# Patient Record
Sex: Female | Born: 1960 | Race: Black or African American | Hispanic: No | Marital: Single | Smoking: Former smoker
Health system: Southern US, Community
[De-identification: ages and names within clinical notes are randomized; demographics above are authoritative.]

## PROBLEM LIST (undated history)

## (undated) DIAGNOSIS — B1910 Unspecified viral hepatitis B without hepatic coma: Secondary | ICD-10-CM

## (undated) DIAGNOSIS — M810 Age-related osteoporosis without current pathological fracture: Secondary | ICD-10-CM

## (undated) DIAGNOSIS — Z8742 Personal history of other diseases of the female genital tract: Secondary | ICD-10-CM

## (undated) DIAGNOSIS — J302 Other seasonal allergic rhinitis: Secondary | ICD-10-CM

## (undated) DIAGNOSIS — B373 Candidiasis of vulva and vagina: Secondary | ICD-10-CM

## (undated) DIAGNOSIS — M62830 Muscle spasm of back: Secondary | ICD-10-CM

## (undated) DIAGNOSIS — Z87898 Personal history of other specified conditions: Secondary | ICD-10-CM

## (undated) DIAGNOSIS — Z8619 Personal history of other infectious and parasitic diseases: Secondary | ICD-10-CM

## (undated) DIAGNOSIS — E119 Type 2 diabetes mellitus without complications: Secondary | ICD-10-CM

## (undated) DIAGNOSIS — F419 Anxiety disorder, unspecified: Secondary | ICD-10-CM

## (undated) DIAGNOSIS — N926 Irregular menstruation, unspecified: Secondary | ICD-10-CM

## (undated) DIAGNOSIS — T7840XA Allergy, unspecified, initial encounter: Secondary | ICD-10-CM

## (undated) DIAGNOSIS — N3941 Urge incontinence: Secondary | ICD-10-CM

## (undated) DIAGNOSIS — R102 Pelvic and perineal pain: Secondary | ICD-10-CM

## (undated) DIAGNOSIS — M1990 Unspecified osteoarthritis, unspecified site: Secondary | ICD-10-CM

## (undated) DIAGNOSIS — D219 Benign neoplasm of connective and other soft tissue, unspecified: Secondary | ICD-10-CM

## (undated) HISTORY — DX: Type 2 diabetes mellitus without complications: E11.9

## (undated) HISTORY — DX: Benign neoplasm of connective and other soft tissue, unspecified: D21.9

## (undated) HISTORY — DX: Personal history of other infectious and parasitic diseases: Z86.19

## (undated) HISTORY — DX: Personal history of other diseases of the female genital tract: Z87.42

## (undated) HISTORY — DX: Personal history of other specified conditions: Z87.898

## (undated) HISTORY — DX: Urge incontinence: N39.41

## (undated) HISTORY — PX: KNEE SURGERY: SHX244

## (undated) HISTORY — PX: WISDOM TOOTH EXTRACTION: SHX21

## (undated) HISTORY — DX: Pelvic and perineal pain: R10.2

## (undated) HISTORY — DX: Allergy, unspecified, initial encounter: T78.40XA

## (undated) HISTORY — DX: Irregular menstruation, unspecified: N92.6

## (undated) HISTORY — DX: Anxiety disorder, unspecified: F41.9

## (undated) HISTORY — DX: Age-related osteoporosis without current pathological fracture: M81.0

## (undated) HISTORY — DX: Unspecified viral hepatitis B without hepatic coma: B19.10

## (undated) HISTORY — PX: BREAST BIOPSY: SHX20

## (undated) HISTORY — DX: Unspecified osteoarthritis, unspecified site: M19.90

## (undated) HISTORY — DX: Candidiasis of vulva and vagina: B37.3

## (undated) HISTORY — PX: DILATION AND CURETTAGE OF UTERUS: SHX78

## (undated) HISTORY — PX: JOINT REPLACEMENT: SHX530

## (undated) HISTORY — DX: Muscle spasm of back: M62.830

---

## 1997-07-28 ENCOUNTER — Emergency Department (HOSPITAL_COMMUNITY): Admission: EM | Admit: 1997-07-28 | Discharge: 1997-07-28 | Payer: Self-pay | Admitting: Emergency Medicine

## 1998-08-08 ENCOUNTER — Other Ambulatory Visit: Admission: RE | Admit: 1998-08-08 | Discharge: 1998-08-08 | Payer: Self-pay | Admitting: Internal Medicine

## 1998-09-22 ENCOUNTER — Ambulatory Visit (HOSPITAL_BASED_OUTPATIENT_CLINIC_OR_DEPARTMENT_OTHER): Admission: RE | Admit: 1998-09-22 | Discharge: 1998-09-22 | Payer: Self-pay | Admitting: Orthopedic Surgery

## 2000-06-06 ENCOUNTER — Other Ambulatory Visit: Admission: RE | Admit: 2000-06-06 | Discharge: 2000-06-06 | Payer: Self-pay | Admitting: Obstetrics and Gynecology

## 2000-08-22 ENCOUNTER — Ambulatory Visit (HOSPITAL_COMMUNITY): Admission: RE | Admit: 2000-08-22 | Discharge: 2000-08-22 | Payer: Self-pay | Admitting: Obstetrics and Gynecology

## 2000-08-22 ENCOUNTER — Encounter: Payer: Self-pay | Admitting: Obstetrics and Gynecology

## 2001-12-12 ENCOUNTER — Other Ambulatory Visit: Admission: RE | Admit: 2001-12-12 | Discharge: 2001-12-12 | Payer: Self-pay | Admitting: Internal Medicine

## 2001-12-26 ENCOUNTER — Emergency Department (HOSPITAL_COMMUNITY): Admission: EM | Admit: 2001-12-26 | Discharge: 2001-12-26 | Payer: Self-pay | Admitting: Emergency Medicine

## 2002-08-14 ENCOUNTER — Encounter: Payer: Self-pay | Admitting: Emergency Medicine

## 2002-08-14 ENCOUNTER — Emergency Department (HOSPITAL_COMMUNITY): Admission: EM | Admit: 2002-08-14 | Discharge: 2002-08-14 | Payer: Self-pay | Admitting: Emergency Medicine

## 2002-08-16 ENCOUNTER — Emergency Department (HOSPITAL_COMMUNITY): Admission: EM | Admit: 2002-08-16 | Discharge: 2002-08-16 | Payer: Self-pay | Admitting: Emergency Medicine

## 2002-09-26 ENCOUNTER — Other Ambulatory Visit: Admission: RE | Admit: 2002-09-26 | Discharge: 2002-09-26 | Payer: Self-pay | Admitting: Obstetrics and Gynecology

## 2002-10-10 ENCOUNTER — Ambulatory Visit (HOSPITAL_COMMUNITY): Admission: RE | Admit: 2002-10-10 | Discharge: 2002-10-10 | Payer: Self-pay | Admitting: Obstetrics and Gynecology

## 2002-10-10 ENCOUNTER — Encounter: Payer: Self-pay | Admitting: Obstetrics and Gynecology

## 2004-02-09 DIAGNOSIS — B3731 Acute candidiasis of vulva and vagina: Secondary | ICD-10-CM

## 2004-02-09 DIAGNOSIS — B373 Candidiasis of vulva and vagina: Secondary | ICD-10-CM

## 2004-02-09 HISTORY — DX: Candidiasis of vulva and vagina: B37.3

## 2004-02-09 HISTORY — DX: Acute candidiasis of vulva and vagina: B37.31

## 2005-02-08 DIAGNOSIS — Z8742 Personal history of other diseases of the female genital tract: Secondary | ICD-10-CM

## 2005-02-08 HISTORY — DX: Personal history of other diseases of the female genital tract: Z87.42

## 2005-10-26 ENCOUNTER — Other Ambulatory Visit: Admission: RE | Admit: 2005-10-26 | Discharge: 2005-10-26 | Payer: Self-pay | Admitting: Obstetrics and Gynecology

## 2005-10-26 LAB — CONVERTED CEMR LAB: Pap Smear: NORMAL

## 2005-11-03 ENCOUNTER — Encounter: Admission: RE | Admit: 2005-11-03 | Discharge: 2005-11-03 | Payer: Self-pay | Admitting: Obstetrics and Gynecology

## 2005-11-03 ENCOUNTER — Encounter (INDEPENDENT_AMBULATORY_CARE_PROVIDER_SITE_OTHER): Payer: Self-pay | Admitting: *Deleted

## 2006-02-08 DIAGNOSIS — N3941 Urge incontinence: Secondary | ICD-10-CM

## 2006-02-08 HISTORY — DX: Urge incontinence: N39.41

## 2006-11-08 ENCOUNTER — Encounter: Payer: Self-pay | Admitting: Infectious Diseases

## 2006-11-14 DIAGNOSIS — B1910 Unspecified viral hepatitis B without hepatic coma: Secondary | ICD-10-CM

## 2006-11-14 HISTORY — DX: Unspecified viral hepatitis B without hepatic coma: B19.10

## 2006-12-14 ENCOUNTER — Ambulatory Visit: Payer: Self-pay | Admitting: Infectious Diseases

## 2006-12-14 DIAGNOSIS — D259 Leiomyoma of uterus, unspecified: Secondary | ICD-10-CM | POA: Insufficient documentation

## 2006-12-14 DIAGNOSIS — IMO0002 Reserved for concepts with insufficient information to code with codable children: Secondary | ICD-10-CM | POA: Insufficient documentation

## 2006-12-14 DIAGNOSIS — M1710 Unilateral primary osteoarthritis, unspecified knee: Secondary | ICD-10-CM

## 2006-12-14 DIAGNOSIS — B1910 Unspecified viral hepatitis B without hepatic coma: Secondary | ICD-10-CM | POA: Insufficient documentation

## 2006-12-14 LAB — CONVERTED CEMR LAB
Albumin: 3.6 g/dL (ref 3.5–5.2)
BUN: 9 mg/dL (ref 6–23)
Basophils Absolute: 0 10*3/uL (ref 0.0–0.1)
CO2: 25 meq/L (ref 19–32)
Calcium: 8.8 mg/dL (ref 8.4–10.5)
Chloride: 103 meq/L (ref 96–112)
Eosinophils Relative: 1 % (ref 0–5)
Glucose, Bld: 92 mg/dL (ref 70–99)
HCT: 39 % (ref 36.0–46.0)
HEP B PCR: 19700 — ABNORMAL HIGH (ref <100)
HIV 1 RNA Quant: <50 copies/mL (ref <50)
HIV-1 RNA Quant, Log: <1.7 (ref <1.70)
Hemoglobin: 12.8 g/dL (ref 12.0–15.0)
Hep A Total Ab: POSITIVE — AB
Hep B E Ag: NEGATIVE
Hepatitis B Surface Ag: POSITIVE — AB
Lymphocytes Relative: 28 % (ref 12–46)
Lymphs Abs: 3 10*3/uL (ref 0.7–3.3)
Monocytes Absolute: 0.6 10*3/uL (ref 0.2–0.7)
Monocytes Relative: 6 % (ref 3–11)
Neutro Abs: 6.9 10*3/uL (ref 1.7–7.7)
Potassium: 4.1 meq/L (ref 3.5–5.3)
RBC: 4.61 M/uL (ref 3.87–5.11)
RDW: 15.1 % — ABNORMAL HIGH (ref 11.5–14.0)
Sodium: 134 meq/L — ABNORMAL LOW (ref 135–145)
Total Protein: 7.3 g/dL (ref 6.0–8.3)

## 2007-01-03 ENCOUNTER — Ambulatory Visit: Payer: Self-pay | Admitting: Infectious Diseases

## 2007-01-10 ENCOUNTER — Encounter: Payer: Self-pay | Admitting: Infectious Diseases

## 2007-02-09 DIAGNOSIS — Z8742 Personal history of other diseases of the female genital tract: Secondary | ICD-10-CM

## 2007-02-09 DIAGNOSIS — Z87898 Personal history of other specified conditions: Secondary | ICD-10-CM

## 2007-02-09 HISTORY — DX: Personal history of other diseases of the female genital tract: Z87.42

## 2007-02-09 HISTORY — DX: Personal history of other specified conditions: Z87.898

## 2007-03-07 ENCOUNTER — Encounter: Admission: RE | Admit: 2007-03-07 | Discharge: 2007-03-07 | Payer: Self-pay | Admitting: Obstetrics and Gynecology

## 2007-03-13 ENCOUNTER — Encounter: Admission: RE | Admit: 2007-03-13 | Discharge: 2007-03-13 | Payer: Self-pay | Admitting: Obstetrics and Gynecology

## 2007-04-03 ENCOUNTER — Inpatient Hospital Stay (HOSPITAL_COMMUNITY): Admission: RE | Admit: 2007-04-03 | Discharge: 2007-04-05 | Payer: Self-pay | Admitting: Obstetrics and Gynecology

## 2007-04-03 ENCOUNTER — Encounter (INDEPENDENT_AMBULATORY_CARE_PROVIDER_SITE_OTHER): Payer: Self-pay | Admitting: Obstetrics and Gynecology

## 2007-04-03 DIAGNOSIS — D219 Benign neoplasm of connective and other soft tissue, unspecified: Secondary | ICD-10-CM

## 2007-04-03 HISTORY — PX: MYOMECTOMY ABDOMINAL APPROACH: SUR870

## 2007-04-03 HISTORY — DX: Benign neoplasm of connective and other soft tissue, unspecified: D21.9

## 2007-05-09 ENCOUNTER — Telehealth (INDEPENDENT_AMBULATORY_CARE_PROVIDER_SITE_OTHER): Payer: Self-pay | Admitting: *Deleted

## 2007-08-21 ENCOUNTER — Encounter: Payer: Self-pay | Admitting: Infectious Diseases

## 2008-06-12 ENCOUNTER — Inpatient Hospital Stay (HOSPITAL_COMMUNITY): Admission: RE | Admit: 2008-06-12 | Discharge: 2008-06-14 | Payer: Self-pay | Admitting: Orthopedic Surgery

## 2010-02-26 ENCOUNTER — Other Ambulatory Visit: Payer: Self-pay | Admitting: Family Medicine

## 2010-02-26 ENCOUNTER — Ambulatory Visit
Admission: RE | Admit: 2010-02-26 | Discharge: 2010-02-26 | Payer: Self-pay | Source: Home / Self Care | Attending: Family Medicine | Admitting: Family Medicine

## 2010-02-26 DIAGNOSIS — M1990 Unspecified osteoarthritis, unspecified site: Secondary | ICD-10-CM | POA: Insufficient documentation

## 2010-02-26 LAB — CBC WITH DIFFERENTIAL/PLATELET
Basophils Absolute: 0 10*3/uL (ref 0.0–0.1)
Basophils Relative: 0.4 % (ref 0.0–3.0)
Eosinophils Absolute: 0.1 10*3/uL (ref 0.0–0.7)
Eosinophils Relative: 0.8 % (ref 0.0–5.0)
HCT: 37.1 % (ref 36.0–46.0)
Hemoglobin: 12.2 g/dL (ref 12.0–15.0)
Lymphocytes Relative: 32.6 % (ref 12.0–46.0)
Lymphs Abs: 2.7 10*3/uL (ref 0.7–4.0)
MCHC: 32.8 g/dL (ref 30.0–36.0)
MCV: 85.9 fl (ref 78.0–100.0)
Monocytes Absolute: 0.4 10*3/uL (ref 0.1–1.0)
Monocytes Relative: 5.1 % (ref 3.0–12.0)
Neutro Abs: 5 10*3/uL (ref 1.4–7.7)
Neutrophils Relative %: 61.1 % (ref 43.0–77.0)
Platelets: 193 10*3/uL (ref 150.0–400.0)
RBC: 4.32 Mil/uL (ref 3.87–5.11)
RDW: 15.4 % — ABNORMAL HIGH (ref 11.5–14.6)
WBC: 8.2 10*3/uL (ref 4.5–10.5)

## 2010-02-26 LAB — LIPID PANEL
Cholesterol: 149 mg/dL (ref 0–200)
HDL: 51.9 mg/dL (ref >39.00)
LDL Cholesterol: 87 mg/dL (ref 0–99)
Total CHOL/HDL Ratio: 3
Triglycerides: 53 mg/dL (ref 0.0–149.0)
VLDL: 10.6 mg/dL (ref 0.0–40.0)

## 2010-02-26 LAB — BASIC METABOLIC PANEL
BUN: 13 mg/dL (ref 6–23)
CO2: 26 mEq/L (ref 19–32)
Calcium: 8.7 mg/dL (ref 8.4–10.5)
Chloride: 101 mEq/L (ref 96–112)
Creatinine, Ser: 0.8 mg/dL (ref 0.4–1.2)
GFR: 101.47 mL/min (ref >60.00)
Glucose, Bld: 83 mg/dL (ref 70–99)
Potassium: 4.2 mEq/L (ref 3.5–5.1)
Sodium: 134 mEq/L — ABNORMAL LOW (ref 135–145)

## 2010-02-26 LAB — HEPATIC FUNCTION PANEL
ALT: 20 U/L (ref 0–35)
AST: 19 U/L (ref 0–37)
Albumin: 3.7 g/dL (ref 3.5–5.2)
Alkaline Phosphatase: 84 U/L (ref 39–117)
Bilirubin, Direct: 0.1 mg/dL (ref 0.0–0.3)
Total Bilirubin: 0.7 mg/dL (ref 0.3–1.2)
Total Protein: 7.2 g/dL (ref 6.0–8.3)

## 2010-02-26 LAB — TSH: TSH: 1.13 u[IU]/mL (ref 0.35–5.50)

## 2010-03-01 ENCOUNTER — Encounter: Payer: Self-pay | Admitting: Obstetrics and Gynecology

## 2010-03-12 NOTE — Assessment & Plan Note (Signed)
Summary: BRAND NEW PT/TO EST/PT REQ CPX/COMING IN FASTING/CJR   Vital Signs:  Patient profile:   50 year old female Height:      67 inches Weight:      195 pounds BMI:     30.65 Temp:     98.2 degrees F oral Pulse rate:   64 / minute Pulse rhythm:   regular BP sitting:   116 / 74  (left arm) Cuff size:   regular  Vitals Entered By: Alfred Levins, CMA (February 26, 2010 10:31 AM)  Nutrition Counseling: Patient's BMI is greater than 25 and therefore counseled on weight management options. CC: npx, fasting, no pap   History of Present Illness: New patient to establish care. Patient has history of osteoarthritis with previous right total knee replacement. Questionable history of elevated liver transaminases. No clear history of infectious hepatitis. Reported remote history of peptic ulcers. Patient has fibroid tumors in her uterus and scheduled for hysterectomy this June. Takes no regular medications.  Family history father had lung cancer. Mother with hypertension. Sr. with type 2 diabetes.  Social history the patient is single. One grown child. Smokes one half packs cigs per day and trying to quit. Rare alcohol use. Stressful job with [employer].  Allergies (verified): No Known Drug Allergies  Past History:  Family History: Last updated: 02/26/2010 Family History Lung cancer- father Family History Diabetes 1st degree relative-sister Family History Hypertension  Social History: Last updated: 12/14/2006 Single Former Smoker Alcohol use-no  Risk Factors: Smoking Status: quit (12/14/2006)  Past Medical History: Osteoarthritis  Past Surgical History: Total knee replacement R 2010 PMH-FH-SH reviewed for relevance  Family History: Family History Lung cancer- father Family History Diabetes 1st degree relative-sister Family History Hypertension  Review of Systems       The patient complains of weight gain.  The patient denies anorexia, fever, weight loss,  decreased hearing, hoarseness, chest pain, syncope, dyspnea on exertion, peripheral edema, prolonged cough, headaches, hemoptysis, abdominal pain, melena, hematochezia, severe indigestion/heartburn, hematuria, incontinence, genital sores, muscle weakness, suspicious skin lesions, transient blindness, difficulty walking, depression, unusual weight change, abnormal bleeding, enlarged lymph nodes, and breast masses.    Physical Exam  General:  Well-developed,well-nourished,in no acute distress; alert,appropriate and cooperative throughout examination Head:  Normocephalic and atraumatic without obvious abnormalities. No apparent alopecia or balding. Eyes:  No corneal or conjunctival inflammation noted. EOMI. Perrla. Funduscopic exam benign, without hemorrhages, exudates or papilledema. Vision grossly normal. Ears:  External ear exam shows no significant lesions or deformities.  Otoscopic examination reveals clear canals, tympanic membranes are intact bilaterally without bulging, retraction, inflammation or discharge. Hearing is grossly normal bilaterally. Mouth:  Oral mucosa and oropharynx without lesions or exudates.  Teeth in good repair. Neck:  No deformities, masses, or tenderness noted. Breasts:  gyn Lungs:  Normal respiratory effort, chest expands symmetrically. Lungs are clear to auscultation, no crackles or wheezes. Heart:  Normal rate and regular rhythm. S1 and S2 normal without gallop, murmur, click, rub or other extra sounds. Abdomen:  Bowel sounds positive,abdomen soft and non-tender without masses, organomegaly or hernias noted. Genitalia:  gyn Extremities:  No clubbing, cyanosis, edema, or deformity noted with normal full range of motion of all joints.   Neurologic:  alert & oriented X3 and cranial nerves II-XII intact.   Skin:  no rashes and no suspicious lesions.   Cervical Nodes:  No lymphadenopathy noted Psych:  Oriented X3, normally interactive, good eye contact, not anxious  appearing, and not depressed appearing.  Impression & Recommendations:  Problem # 1:  Preventive Health Care (ICD-V70.0) screening labs.  Needs to lose some weight.  Start exercise.  Tdap. Cont gyn f/u.  Other Orders: Specimen Handling (40102) Venipuncture (72536) Tdap => 2yrs IM (64403) Admin 1st Vaccine (47425) TLB-Lipid Panel (80061-LIPID) TLB-BMP (Basic Metabolic Panel-BMET) (80048-METABOL) TLB-CBC Platelet - w/Differential (85025-CBCD) TLB-Hepatic/Liver Function Pnl (80076-HEPATIC) TLB-TSH (Thyroid Stimulating Hormone) (84443-TSH)  Patient Instructions: 1)  It is important that you exercise reguarly at least 20 minutes 5 times a week. If you develop chest pain, have severe difficulty breathing, or feel very tired, stop exercising immediately and seek medical attention.  2)  You need to lose weight. Consider a lower calorie diet and regular exercise.  3)  Schedule your mammogram.    Orders Added: 1)  New Patient 40-64 years [99386] 2)  Specimen Handling [99000] 3)  Venipuncture [36415] 4)  Tdap => 27yrs IM [90715] 5)  Admin 1st Vaccine [90471] 6)  TLB-Lipid Panel [80061-LIPID] 7)  TLB-BMP (Basic Metabolic Panel-BMET) [80048-METABOL] 8)  TLB-CBC Platelet - w/Differential [85025-CBCD] 9)  TLB-Hepatic/Liver Function Pnl [80076-HEPATIC] 10)  TLB-TSH (Thyroid Stimulating Hormone) [95638-VFI]   Immunizations Administered:  Tetanus Vaccine:    Vaccine Type: Tdap    Site: left deltoid    Mfr: GlaxoSmithKline    Dose: 0.5 ml    Route: IM    Given by: Alfred Levins, CMA    Exp. Date: 11/28/2011    Lot #: [id number]   Immunizations Administered:  Tetanus Vaccine:    Vaccine Type: Tdap    Site: left deltoid    Mfr: GlaxoSmithKline    Dose: 0.5 ml    Route: IM    Given by: Alfred Levins, CMA    Exp. Date: 11/28/2011    Lot #: [id number]

## 2010-03-30 ENCOUNTER — Ambulatory Visit (HOSPITAL_COMMUNITY): Admission: RE | Admit: 2010-03-30 | Payer: Self-pay | Admitting: Obstetrics and Gynecology

## 2010-05-19 LAB — GLUCOSE, CAPILLARY
Glucose-Capillary: 112 mg/dL — ABNORMAL HIGH (ref 70–99)
Glucose-Capillary: 133 mg/dL — ABNORMAL HIGH (ref 70–99)

## 2010-05-19 LAB — URINE CULTURE
Colony Count: NO GROWTH
Culture: NO GROWTH

## 2010-05-19 LAB — CBC
MCHC: 34.1 g/dL (ref 30.0–36.0)
Platelets: 150 10*3/uL (ref 150–400)
Platelets: 176 10*3/uL (ref 150–400)
RDW: 14.9 % (ref 11.5–15.5)
RDW: 15 % (ref 11.5–15.5)
WBC: 9.5 10*3/uL (ref 4.0–10.5)

## 2010-05-19 LAB — PROTIME-INR
INR: 1.3 (ref 0.00–1.49)
INR: 1.9 — ABNORMAL HIGH (ref 0.00–1.49)
Prothrombin Time: 22.8 seconds — ABNORMAL HIGH (ref 11.6–15.2)

## 2010-05-19 LAB — BASIC METABOLIC PANEL
BUN: 3 mg/dL — ABNORMAL LOW (ref 6–23)
BUN: 4 mg/dL — ABNORMAL LOW (ref 6–23)
Calcium: 8.3 mg/dL — ABNORMAL LOW (ref 8.4–10.5)
Chloride: 100 mEq/L (ref 96–112)
Creatinine, Ser: 0.78 mg/dL (ref 0.4–1.2)
Creatinine, Ser: 0.91 mg/dL (ref 0.4–1.2)
GFR calc non Af Amer: >60 mL/min (ref >60)
GFR calc non Af Amer: >60 mL/min (ref >60)
Glucose, Bld: 138 mg/dL — ABNORMAL HIGH (ref 70–99)
Potassium: 4 mEq/L (ref 3.5–5.1)

## 2010-05-19 LAB — ABO/RH: ABO/RH(D): O POS

## 2010-05-19 LAB — TYPE AND SCREEN: ABO/RH(D): O POS

## 2010-05-20 LAB — APTT: aPTT: 31 seconds (ref 24–37)

## 2010-05-20 LAB — BASIC METABOLIC PANEL
BUN: 6 mg/dL (ref 6–23)
CO2: 28 mEq/L (ref 19–32)
Chloride: 108 mEq/L (ref 96–112)
Potassium: 4.3 mEq/L (ref 3.5–5.1)

## 2010-05-20 LAB — HEPATIC FUNCTION PANEL
AST: 18 U/L (ref 0–37)
Albumin: 3.6 g/dL (ref 3.5–5.2)
Alkaline Phosphatase: 104 U/L (ref 39–117)
Total Bilirubin: 0.3 mg/dL (ref 0.3–1.2)
Total Protein: 7.2 g/dL (ref 6.0–8.3)

## 2010-05-20 LAB — URINALYSIS, ROUTINE W REFLEX MICROSCOPIC
Bilirubin Urine: NEGATIVE
Ketones, ur: NEGATIVE mg/dL
Nitrite: NEGATIVE
Protein, ur: NEGATIVE mg/dL
Urobilinogen, UA: 1 mg/dL (ref 0.0–1.0)
pH: 5.5 (ref 5.0–8.0)

## 2010-05-20 LAB — PROTIME-INR
INR: 1 (ref 0.00–1.49)
Prothrombin Time: 13.7 seconds (ref 11.6–15.2)

## 2010-05-20 LAB — CBC
HCT: 39.4 % (ref 36.0–46.0)
Hemoglobin: 13.4 g/dL (ref 12.0–15.0)
RDW: 15.1 % (ref 11.5–15.5)
WBC: 10.2 10*3/uL (ref 4.0–10.5)

## 2010-05-20 LAB — URINE MICROSCOPIC-ADD ON

## 2010-06-23 NOTE — Op Note (Signed)
NAMEJULIEANNE, Sandra Hawkins           ACCOUNT NO.:  [id number]   MEDICAL RECORD NO.:  [id number]          PATIENT TYPE:  INP   LOCATION:  5007                         FACILITY:  [facility]   PHYSICIAN:  Loreta Ave, M.D. DATE OF BIRTH:  03-31-1960   DATE OF PROCEDURE:  06/12/2008  DATE OF DISCHARGE:                               OPERATIVE REPORT   PREOPERATIVE DIAGNOSIS:  Right knee degenerative arthritis, end stage,  varus alignment.   POSTOPERATIVE DIAGNOSIS:  Right knee degenerative arthritis, end stage,  varus alignment.   PROCEDURE:  Modified minimally invasive right total knee replacement,  Stryker Triathlon prosthesis.  Soft tissue balancing.  Cemented pegged  posterior stabilized #4 femoral component.  Cemented #5 tibial component  with 11-mm polyethylene insert.  Cemented pegged medial offset 35 mm  patellar component.   SURGEON:  Loreta Ave, M.D.   ASSISTANT:  Genene Churn. Barry Dienes, [place], present throughout the entire case as  necessary for timely completion of procedure.   ANESTHESIA:  General.   BLOOD LOSS:  Minimal.   TOURNIQUET TIME:  1 hour and 50 minutes.   SPECIMENS:  None.   CULTURES:  None.   COMPLICATIONS:  None.   DRESSINGS:  Sterile compressive with a knee immobilizer.   DRAIN:  Hemovac x1.   PROCEDURE:  The patient was brought to the operating room, placed on the  operating table in supine position.  After adequate anesthesia had been  obtained, knee examined.  Varus alignment correctable to at least  neutral.  Full extension, full flexion.  Stable ligaments.  Tourniquet  applied.  Prepped and draped in usual sterile fashion.  Exsanguinated  with elevation Esmarch.  Tourniquet inflated to 350 mmHg.  Straight  incision above the patella down to the tibial tubercle.  Medial  arthrotomy up to the superomedial border of the patella, splitting  the  vastus muscle, preserving the quad tendon.  Knee exposed.  Grade 4  change throughout.  Medial capsular  released.  Remnants of menisci,  cruciate ligament, loose bodies, and spurs were removed.  Distal femur  exposed.  Intramedullary guide was placed.  Distal cut 10 mm, set at 5  degrees of valgus.  Using epicondylar axis, the femur was sized, cut,  and fitted for #4 posterior stabilized peg component.  Tibia exposed.  Extramedullary guide.  A 3-degree posterior slope cut below the defect  on the medial side where most of the wear was.  Sized to #5 component.  All recess examined.  All loose bodies, all spurs were removed.  Nice  release of soft tissue.  With the trials in place, #4 above, #5 below  and a 11-mm insert, I had a nicely balanced knee, normal mechanical  axis, full extension, full flexion.  Tibia was marked for appropriate  rotation with trials.  Patella exposed.  Measured, cut, drilled, sized  and fitted for # 35 component.  Trials were removed.  Copious irrigation  with pulse irrigating device.  Tibia was then punched for its keel  utilizing the marks from the trials up.  Cement prepared and placed on  all components.  All were  firmly seated.  Polyethylene attached to  tibia.  Patellar component held in place with clamp.  Once the cement  hardened, we examined.  Again very pleased with alignment, stability,  mechanical axis, patella femoral tracking.  Wound irrigated.  Hemovac  placed through a separate stab wound.  Arthrotomy closed with #1 Vicryl.  Skin and subcutaneous tissue with Vicryl and staples.  Knee injected  with Marcaine.  Hemovac clamped.  Sterile compressive dressing applied.  Tourniquet was inflated and removed.  Knee immobilizer applied.  Anesthesia reversed.  Brought to recovery room.  Tolerated surgery well.  No complications.      Loreta Ave, M.D.  Electronically Signed     DFM/MEDQ  D:  06/12/2008  T:  06/13/2008  Job:  [id number]

## 2010-06-23 NOTE — Discharge Summary (Signed)
Sandra Hawkins, Sandra Hawkins           ACCOUNT NO.:  [id number]   MEDICAL RECORD NO.:  [id number]          PATIENT TYPE:  INP   LOCATION:  5007                         FACILITY:  [facility]   PHYSICIAN:  Loreta Ave, M.D. DATE OF BIRTH:  1960-08-17   DATE OF ADMISSION:  06/12/2008  DATE OF DISCHARGE:                               DISCHARGE SUMMARY   FINAL DIAGNOSIS:  Status post right total knee replacement for post-  traumatic end-stage degenerative joint disease.   HISTORY OF PRESENT ILLNESS:  A 50 year old black female with history of  right knee post-traumatic end-stage DJD and knee pain presented to our  office for a preop evaluation for total knee replacement.  She had a  progressively worsening pain with failed response with conservative  treatment.  Significant decrease in her daily activities due to the  ongoing complaint.   HOSPITAL COURSE:  On Jun 12, 2008, patient was taken to the [facility] OR  and a right total knee replacement procedure performed.  Surgeon, Mckinley Jewel, MD, and assistant, Zonia Kief, PA-C.  Anesthesia, general.  EBL  minimal.  Tourniquet time, 1 hour and 50 minutes.  No specimens.  One  Hemovac drain placed.  There were no surgical or anesthesia  complications and patient was transferred to recovery in stable  condition.  Pharmacy protocol Coumadin started for DVT prophylaxis.  Jun 13, 2008, patient doing well.  Pain control.  No complaints.  Temp 98.8.  Pulse 101.  Respirations 18.  Blood pressure 109/63.  WBC 9.5,  hemoglobin 10.3, hematocrit 30.5, platelet count 176, INR 1.3, sodium  136, potassium 4.0, chloride 100, CO2 of 30, BUN 4, creatinine 0.91,  glucose 113.  Dressing clean, dry, and intact.  Calf nontender.  Neurovascularly intact.  PT/OT consults.  Social worker arranging  discharge.  Jun 14, 2008, patient doing very well.  No issues.  She is  progressing well with therapy.  She is hoping to be able to transfer to  [facility] for  rehab.  She does not have any assistance at home.  Worker's Comp not approving home health therapy.  Temp 99.1.  Pulse 95.  Respirations 18.  Blood pressure 97/58.  WBC 10.9, hemoglobin 9.1,  hematocrit 26.8, platelets 150, INR 1.9, sodium 133, potassium 4.2,  chloride 101, CO2 of 25, BUN 3, creatinine 0.78, glucose 138.  Wound  looks good and staples intact.  No drainage or signs of infection.  Hemovac drain discontinued.  Calf nontender at rest.   CONDITION:  Good and stable and ready for transfer when bed available.   DISPOSITION:  Transfer to a skilled nursing facility.   MEDICATIONS:  1. Percocet 5/325 one to 2 tabs p.o. every 4 to 6 hours p.r.n. for      pain.  2. Robaxin 500 mg 1 tab p.o. every 6 hours p.r.n. for spasms.  3. Coumadin, pharmacy protocol.  Maintain INR of 2 to 3 x4 weeks      postop for DVT prophylaxis.   INSTRUCTIONS:  Patient will work with PT and OT to improve ambulation  and knee range of motion and strengthening.  Weight bear as tolerated.  She is okay to shower but no tub soaking.  Daily dressing changes with 4  x 4 gauze and tape.  Do not apply any creams or ointments to her  incision.  Staples will be removed in our office when  she is 2 weeks postop.  She can wean off her walker to a cane when she  is able.  Coumadin x4 weeks postop for DVT prophylaxis.  She will follow  up in the office with Dr. Eulah Pont when she is 2 weeks postop for recheck.  Notify our office immediately if there are any questions or concerns  before that time at [phone].      Genene Churn. Denton Meek.      Loreta Ave, M.D.  Electronically Signed    JMO/MEDQ  D:  06/14/2008  T:  06/14/2008  Job:  [id number]

## 2010-06-23 NOTE — Op Note (Signed)
Sandra Hawkins, Sandra Hawkins           ACCOUNT NO.:  [id number]   MEDICAL RECORD NO.:  [id number]          PATIENT TYPE:  INP   LOCATION:  9319                          FACILITY[facility]  [facility]   PHYSICIAN:  Osborn Coho, M.D.   DATE OF BIRTH:  [birth year]   DATE OF PROCEDURE:  04/04/2007  DATE OF DISCHARGE:                               OPERATIVE REPORT   PREOPERATIVE DIAGNOSIS:  Symptomatic fibroids.   POSTOPERATIVE DIAGNOSIS:  Symptomatic fibroids.   PROCEDURE:  Abdominal myomectomy.   ATTENDING:  Osborn Coho, M.D.   ASSISTANT:  Jaymes Graff, MD   ANESTHESIA:  General.   SPECIMENS TO PATHOLOGY:  48 fibroids, one of which was sent separately  secondary to questionable degeneration.   FLUIDS:  2400 mL.   URINE OUTPUT:  200 mL.   ESTIMATED BLOOD LOSS:  450 mL.   COMPLICATIONS:  None.   PROCEDURE:  The patient was taken to the operating room after risks,  benefits, and alternatives reviewed with the patient.  The patient  verbalized understanding and consent signed and witnessed.  The patient  was placed under general per anesthesia and prepped and draped in a  normal sterile fashion in the supine position.  A Pfannenstiel skin  incision was made and carried down to the underlying layer of fascia  with the scalpel and the Bovie.  The fascia was excised bilaterally in  the midline extended bilaterally with the Mayo scissors.  Kocher clamps  were placed on the superior aspect of fascial incision and the rectus  muscles excised from the fascia.  The same was done on the inferior  aspect of fascial incision.  The muscle was separated in midline and the  peritoneum entered with the Metzenbaum scissors and bluntly while  tenting up.  The peritoneal incision was extended manually and O'Connor-  O'Sullivan self-retaining retractor was placed after the laparotomy  sponges placed.  There were multiple fibroids noted and each was removed  after injecting with dilute Pitressin, excising  with the Bovie and  shelling out and removing.  The anterior wall was performed first and  then repaired and the posterior wall was then performed and repaired.  The largest fibroid was noted in the posterior wall and that was the one  which was noted to be questionably degenerative and measured  approximately 8 cm.  Attention was then turned to the fundus where  approximately 3-4 cm sized fundal fibroid was removed and the area  repaired.  All fibroids were sent to pathology.  There did not seem to  be any more fibroids remaining; however, secondary to the multiple  nature of her fibroids, it is possible that very small fibroids were  left behind.  Clearly the majority of fibroids were removed and no  obvious fibroids remained.  There was only slight oozing at the incision  sites which were mostly hemostatic.  To ensure hemostasis, Surgicel was  applied to all of the inside surfaces.  This was performed after copious  intra-abdominal irrigation.  The laparotomy sponges were removed with  care in order to keep the Surgicel in place.  The O'Connor-O'Sullivan  was  removed as well.  The peritoneum was repaired with 2-0 chromic in a  running fashion.  The fascia was repaired with 0-0 Vicryl in a running  fashion.  The subcutaneous tissue was reapproximated using 2-0 plain via  four interrupted stitches and the skin was reapproximated using 3-0  Monocryl via a subcuticular stitch and Dermabond applied as well.  Sponge lap and needle count was correct.  The patient tolerated  procedure well and was returned to the recovery room in good condition.      Osborn Coho, M.D.  Electronically Signed     AR/MEDQ  D:  04/04/2007  T:  04/04/2007  Job:  [id number]

## 2010-06-23 NOTE — H&P (Signed)
Sandra Hawkins, Sandra Hawkins           ACCOUNT NO.:  [id number]   MEDICAL RECORD NO.:  [id number]          PATIENT TYPE:  INP   LOCATION:  9319                          FACILITY:  [facility]   PHYSICIAN:  Osborn Coho, M.D.   DATE OF BIRTH:  02-Jan-1961   DATE OF ADMISSION:  04/03/2007  DATE OF DISCHARGE:                              HISTORY & PHYSICAL   CHIEF COMPLAINT:  Heavy menstrual cycles and frequent urination.   HISTORY:  Sandra Hawkins is a 50 year old gravida 3, para 1 with the last  menstrual period of March 08, 2007 with fibroids and complaints of  heavy menstrual cycles and frequent urination that she believes is also  the result of her fibroids.  The options were discussed with Ms.  Hawkins and they included hormonal management versus surgical  management.  The patient wanted removal of the fibroids and preservation  of her uterus.  She did report that she had wanted to preserve  fertility.  She understands that even after the myomectomy she may be  unable to conceive with the possibility of scarring that may occur, and  also because of age.   PAST OB HISTORY:  Normal spontaneous vaginal delivery full term x1,  spontaneous abortion x1, elected termination of pregnancy x1.   PAST GYN HISTORY:  History of regular cycles.  Denies a history of  gonorrhea or chlamydia.  Positive recent acute episode of hepatitis B,  normal mammogram per patient within the last month and denies a history  of abnormal Pap smears.   PAST MEDICAL HISTORY:  Denies except recent acute episode of hepatitis B  and rheumatoid arthritis.   PAST SURGICAL HISTORY:  D&C and arthroscopic knee surgery.   MEDICINES:  None.   ALLERGIES:  No known drug allergies.   SOCIAL HISTORY:  Denies cigarette use, alcohol use, or drug use, and  lives alone.   FAMILY HISTORY:  History of cancer in her father which was lung cancer  and maternal grandmother with asthma and diabetes.   REVIEW OF SYSTEMS:  Denies chest  pain or shortness of breath with  activity, and denies any recent fevers, chills, nausea, vomiting, GI or  GU problems.   PHYSICAL EXAM:  HEENT: Within normal limits.  NECK:  Thyroid not enlarged.  HEART:  Rate and rhythm are regular.  CHEST:  Clear to auscultation bilaterally.  BREASTS:  No masses or discharge, skin changes, or nipple retraction.  BACK:  No CVA tenderness.  ABDOMEN: Soft and nontender.  EXTREMITIES: Within normal limits.  PELVIC EXAM:  External genitalia within normal limits.  Uterus  nontender, and no palpable adnexal masses.  The patient had an  ultrasound, approximately a year ago, which showed at least 5 fibroids  with a largest measuring almost 6 cm.   ASSESSMENT/PLAN:  Sandra Hawkins is a 50 year old para 1 with symptomatic  fibroids desiring a myomectomy.  Abdominal myomectomy was discussed at  length with the patient.  The risks, benefits, and alternatives were  discussed as well; including but not limited to bleeding, infection,  injury, and risk of infertility.  The patient verbalized understanding,  and consent to  be signed and witnessed the day of the procedure.  The  patient's questions answered.      Osborn Coho, M.D.  Electronically Signed     AR/MEDQ  D:  04/04/2007  T:  04/04/2007  Job:  [id number]

## 2010-06-26 NOTE — Discharge Summary (Signed)
Sandra Hawkins, Sandra Hawkins           ACCOUNT NO.:  [id number]   MEDICAL RECORD NO.:  [id number]          PATIENT TYPE:  INP   LOCATION:  9319                          FACILITY[facility]  [facility]   PHYSICIAN:  Osborn Coho, M.D.   DATE OF BIRTH:  1960/07/12   DATE OF ADMISSION:  04/03/2007  DATE OF DISCHARGE:  04/05/2007                               DISCHARGE SUMMARY   DISCHARGE DIAGNOSIS:  Symptomatic uterine fibroids.   OPERATION:  On the date of admission, the patient underwent an abdominal  myomectomy tolerating the procedure well. The patient had 48 fibroids  removed.   HISTORY OF PRESENT ILLNESS:  Sandra Hawkins is a 50 year old female, para  1-0-2-1, who presents for abdominal myomectomy because of heavy  menstrual cycles and urinary frequency, attributed to her enlarging  fibroids.  Please see patient's dictated history and physical  examination for details.   PHYSICAL EXAMINATION:  GENERAL EXAM:  Within  normal limits.  PELVIC EXAM:  External genitalia was within normal limits.  Uterus was  nontender without any palpable adnexal masses. The patient had an  ultrasound which showed at least 5 fibroids with the largest measuring  approximately 6 cm.   HOSPITAL COURSE:  On the date of admission the patient underwent  aforementioned procedure, tolerating it well.  Postoperative course was  unremarkable with the patient tolerating the postop hemoglobin of 9.2  (preop hemoglobin 13.4).  By postop day #2, the patient had resumed  bowel and bladder function, and was therefore deemed ready for discharge  home.  The patient's discharge basic metabolic panel was within normal  limits.   DISCHARGE MEDICATIONS:  1. Colace 100 mg twice daily until bowel movements are regular.  2. Repliva 1 tablet daily for 21 days, discontinue for 7 days, then      resume cycle again for another 27 days.  3. Motrin 600 mg every 6 hours as needed for pain.  4. Dilaudid 2 mg 1 or 2 tablets every 4 hours as needed  for severe      pain.   FOLLOWUP:  The patient is to follow up with Dr. Su Hilt for 6 weeks  postoperative visit on May 16, 2007, at 2 p.m.   DISCHARGE INSTRUCTIONS:  The patient was given a copy of [facility] postoperative instruction sheet.  She was further  advised to avoid driving for 2 weeks, heavy lifting for 4 weeks,  intercourse for 6 weeks, that she may use the shower, she may walk up  steps.  She is to keep her incisions clean and dry.  Her diet was  without restriction.   FINAL PATHOLOGY:  Uterus myomectomy:  Leiomyomata.  Uterus myomectomy:  Smooth muscle tumor with extensive necrosis and degenerative changes.      [place] J. Adline Peals.      Osborn Coho, M.D.  Electronically Signed    EJP/MEDQ  D:  04/22/2007  T:  04/23/2007  Job:  [id number]

## 2010-10-30 LAB — BASIC METABOLIC PANEL
CO2: 28
Chloride: 104
GFR calc Af Amer: >60
Potassium: 4.1

## 2010-10-30 LAB — CBC
HCT: 26.9 — ABNORMAL LOW
Hemoglobin: 13.4
MCHC: 33.9
MCHC: 34.1
MCV: 83.5
MCV: 84.6
RBC: 3.18 — ABNORMAL LOW
RBC: 4.74
WBC: 10.1
WBC: 15.4 — ABNORMAL HIGH

## 2010-10-30 LAB — HCG, SERUM, QUALITATIVE: Preg, Serum: NEGATIVE

## 2011-04-19 ENCOUNTER — Ambulatory Visit: Payer: Self-pay | Admitting: Family Medicine

## 2011-07-16 ENCOUNTER — Encounter: Payer: Self-pay | Admitting: Obstetrics and Gynecology

## 2011-07-16 ENCOUNTER — Ambulatory Visit (INDEPENDENT_AMBULATORY_CARE_PROVIDER_SITE_OTHER): Payer: 59 | Admitting: Obstetrics and Gynecology

## 2011-07-16 VITALS — BP 100/70 | HR 72 | Resp 16 | Ht 68.0 in | Wt 196.0 lb

## 2011-07-16 DIAGNOSIS — N912 Amenorrhea, unspecified: Secondary | ICD-10-CM

## 2011-07-16 DIAGNOSIS — N920 Excessive and frequent menstruation with regular cycle: Secondary | ICD-10-CM

## 2011-07-16 DIAGNOSIS — Z124 Encounter for screening for malignant neoplasm of cervix: Secondary | ICD-10-CM

## 2011-07-16 DIAGNOSIS — Z13820 Encounter for screening for osteoporosis: Secondary | ICD-10-CM

## 2011-07-16 DIAGNOSIS — N926 Irregular menstruation, unspecified: Secondary | ICD-10-CM

## 2011-07-16 DIAGNOSIS — R32 Unspecified urinary incontinence: Secondary | ICD-10-CM

## 2011-07-16 LAB — CBC
HCT: 39.4 % (ref 36.0–46.0)
Hemoglobin: 12.8 g/dL (ref 12.0–15.0)
RBC: 4.67 MIL/uL (ref 3.87–5.11)
WBC: 9.7 10*3/uL (ref 4.0–10.5)

## 2011-07-16 LAB — POCT URINE PREGNANCY: Preg Test, Ur: NEGATIVE

## 2011-07-16 NOTE — Progress Notes (Signed)
Contraception none Last pap 04/03/2009 wnl Last Mammo 2011 wnl Last Colonoscopy none Last Dexa Scan none [facility] none  Pt wants hysterectomy secondary to return of fibroids.  H/o Myomectomy. C/o Mixed Incontinence.  Filed Vitals:   07/16/11 1414  BP: 100/70  Pulse: 72  Resp: 16   ROS: noncontributory  Physical Examination: General appearance - alert, well appearing, and in no distress Neck - supple, no significant adenopathy Chest - clear to auscultation, no wheezes, rales or rhonchi, symmetric air entry Heart - normal rate and regular rhythm Abdomen - soft, nontender, nondistended, no masses or organomegaly Breasts - breasts appear normal, no suspicious masses, no skin or nipple changes or axillary nodes Pelvic - normal external genitalia, vulva, vagina, cervix, uterus and adnexa, light menses Back exam - no CVAT Extremities - no edema, redness or tenderness in the calves or thighs  A/P sched u/s and EmBx same day sched cystometrics ASAP - CC urine cx today Labs today - TSH, PRL, CBC, Vit D If unable to get on surgery schedule for July or August, pt may consider getting depo lupron shot. Tentative surgery plan until finish w/u - TLH, poss LAVH, poss TAH, TVT, cystoscopy. (Pt wants to keep her ovaries) Will start scheduling process as pt is requesting procedure be done ASAP.

## 2011-07-16 NOTE — Progress Notes (Signed)
Addended by: Mathis Bud on: 07/16/2011 03:28 PM   Modules accepted: Orders

## 2011-07-17 LAB — PROLACTIN: Prolactin: 6.5 ng/mL

## 2011-07-17 LAB — TSH: TSH: 1.229 u[IU]/mL (ref 0.350–4.500)

## 2011-07-18 LAB — URINE CULTURE: Colony Count: 7000

## 2011-07-19 LAB — PAP IG W/ RFLX HPV ASCU

## 2011-07-27 ENCOUNTER — Telehealth: Payer: Self-pay | Admitting: Obstetrics and Gynecology

## 2011-07-27 NOTE — Telephone Encounter (Signed)
Call to pt to schedule lumax per AR scheduled 08/05/11 DF

## 2011-07-30 ENCOUNTER — Telehealth: Payer: Self-pay | Admitting: Obstetrics and Gynecology

## 2011-08-04 ENCOUNTER — Other Ambulatory Visit: Payer: Self-pay | Admitting: Obstetrics and Gynecology

## 2011-08-05 ENCOUNTER — Ambulatory Visit (INDEPENDENT_AMBULATORY_CARE_PROVIDER_SITE_OTHER): Payer: 59 | Admitting: Obstetrics and Gynecology

## 2011-08-05 ENCOUNTER — Other Ambulatory Visit: Payer: Self-pay

## 2011-08-05 DIAGNOSIS — R32 Unspecified urinary incontinence: Secondary | ICD-10-CM

## 2011-08-09 ENCOUNTER — Other Ambulatory Visit: Payer: 59

## 2011-08-09 ENCOUNTER — Ambulatory Visit (INDEPENDENT_AMBULATORY_CARE_PROVIDER_SITE_OTHER): Payer: 59 | Admitting: Obstetrics and Gynecology

## 2011-08-09 ENCOUNTER — Encounter: Payer: Self-pay | Admitting: Obstetrics and Gynecology

## 2011-08-09 VITALS — BP 102/68 | Temp 98.0°F | Resp 16 | Ht 68.0 in | Wt 198.0 lb

## 2011-08-09 DIAGNOSIS — D219 Benign neoplasm of connective and other soft tissue, unspecified: Secondary | ICD-10-CM

## 2011-08-09 DIAGNOSIS — D259 Leiomyoma of uterus, unspecified: Secondary | ICD-10-CM

## 2011-08-09 DIAGNOSIS — Z3201 Encounter for pregnancy test, result positive: Secondary | ICD-10-CM

## 2011-08-09 DIAGNOSIS — N920 Excessive and frequent menstruation with regular cycle: Secondary | ICD-10-CM

## 2011-08-09 DIAGNOSIS — Z13820 Encounter for screening for osteoporosis: Secondary | ICD-10-CM

## 2011-08-09 LAB — POCT URINE PREGNANCY: Preg Test, Ur: NEGATIVE

## 2011-08-09 NOTE — Progress Notes (Signed)
done

## 2011-08-09 NOTE — Progress Notes (Signed)
Pt c/o mostly leaking with urgency  Filed Vitals:   08/09/11 1729  BP: 102/68  Temp: 98 F (36.7 C)  Resp: 16    Lumax testing only one episode of leaking.  Ext genitalia wnl Cervix and vagina wnl with moderate bleeding now on menses EmBx pipelle passed x 3 to 10cm per protocol U/S 9.3 x 6.6 x 5.4 cm UT, bilateral nl ovaries, 5 fibroids largest 1.9cms  Results for orders placed in visit on 08/09/11  POCT URINE PREGNANCY      Component Value Range   Preg Test, Ur Negative      A/P Pt requesting more provera to help control bleeding until surgery scheduled on 09/20/11 I discussed option of mgmt for urinary incontinence and trial of detrol la.  Pt is agreeable. Although she occasionally has episodes of SUI she mostly c/o OAB. RTO 4wks to f/u detrol la EmBx done today without complication

## 2011-08-10 MED ORDER — MEDROXYPROGESTERONE ACETATE 10 MG PO TABS: 10.0000 mg | ORAL_TABLET | Freq: Every day | ORAL | Status: DC

## 2011-08-10 MED ORDER — TOLTERODINE TARTRATE ER 4 MG PO CP24: 4.0000 mg | ORAL_CAPSULE | Freq: Every day | ORAL | Status: DC

## 2011-08-20 ENCOUNTER — Ambulatory Visit (INDEPENDENT_AMBULATORY_CARE_PROVIDER_SITE_OTHER): Payer: 59

## 2011-08-20 ENCOUNTER — Other Ambulatory Visit: Payer: Self-pay | Admitting: Obstetrics and Gynecology

## 2011-08-20 DIAGNOSIS — N920 Excessive and frequent menstruation with regular cycle: Secondary | ICD-10-CM

## 2011-08-30 ENCOUNTER — Encounter (HOSPITAL_COMMUNITY): Payer: Self-pay | Admitting: Pharmacist

## 2011-09-06 ENCOUNTER — Encounter: Payer: Self-pay | Admitting: Obstetrics and Gynecology

## 2011-09-06 ENCOUNTER — Encounter (HOSPITAL_COMMUNITY)
Admission: RE | Admit: 2011-09-06 | Discharge: 2011-09-06 | Disposition: A | Discharge status: Home / Self Care | Payer: 59 | Source: Ambulatory Visit | Attending: Obstetrics and Gynecology | Admitting: Obstetrics and Gynecology

## 2011-09-06 ENCOUNTER — Encounter (HOSPITAL_COMMUNITY): Payer: Self-pay

## 2011-09-06 ENCOUNTER — Other Ambulatory Visit: Payer: Self-pay | Admitting: Obstetrics and Gynecology

## 2011-09-06 ENCOUNTER — Ambulatory Visit (INDEPENDENT_AMBULATORY_CARE_PROVIDER_SITE_OTHER): Payer: 59 | Admitting: Obstetrics and Gynecology

## 2011-09-06 VITALS — BP 114/72 | HR 70 | Temp 98.2°F | Resp 16 | Ht 67.5 in | Wt 199.0 lb

## 2011-09-06 DIAGNOSIS — Z01818 Encounter for other preprocedural examination: Secondary | ICD-10-CM

## 2011-09-06 LAB — CBC
Hemoglobin: 12.7 g/dL (ref 12.0–15.0)
MCH: 27 pg (ref 26.0–34.0)
MCHC: 32.5 g/dL (ref 30.0–36.0)
Platelets: 198 10*3/uL (ref 150–400)
RDW: 15.1 % (ref 11.5–15.5)

## 2011-09-06 LAB — COMPREHENSIVE METABOLIC PANEL
ALT: 14 U/L (ref 0–35)
Albumin: 3.4 g/dL — ABNORMAL LOW (ref 3.5–5.2)
Calcium: 9 mg/dL (ref 8.4–10.5)
GFR calc Af Amer: 87 mL/min — ABNORMAL LOW (ref >90)
Glucose, Bld: 92 mg/dL (ref 70–99)
Sodium: 134 mEq/L — ABNORMAL LOW (ref 135–145)
Total Protein: 7.5 g/dL (ref 6.0–8.3)

## 2011-09-06 NOTE — OR Nursing (Signed)
Modified surgery...removed TVT procedure and added cysto per Adirenne Prigden.

## 2011-09-06 NOTE — OR Nursing (Signed)
Modified surgery...added TVT back to case per Hansel Starling

## 2011-09-06 NOTE — Progress Notes (Signed)
Sandra Hawkins is a 51 y.o. female [phone] who presents for a total laparoscopic hysterectomy along with placement of tension free vaginal tape because of symptomatic uterine fibroid, menorrhagia and mixed urinary incontinence.  Patient underwent an abdominal myomectomy in 2009 with good restoration of a normal cycle and flow however, over the past year her menses has gotten progressively worse.  Patient state her flow will last for 7 day  and cause her to change her heavy flow pad every 1-1.5 hours.  She has on occasion soiled her clothes and passes moderate clots. Her cramping does not require analgesia but will bleed, just like her period, at other times during the month for the same 7 day duration.  She admits to dyspareunia, urinary urgency, leaking of urine with and without val salva maneuver, nocturia and frequency.  She denies however, vaginitis symptoms, dysuria, flank pain or hematuria.  Patient was given Provera 10 mg daily for her bleeding and Detrol LA for urinary symptoms however, she did not fill the prescription for either.  She underwent urodynamic studies in June of this year with results being consistent with mixed urinary incontinence.  Pelvic ultrasound (08/20/11) uterus 9.3 x 6.6 x 5.4 cm with 5 fibroids (largest 1.9 cm) with normal appearing ovaries. Endometrial biopsy performed 08/09/11 showed benign polyp without evidence of atypia, hyperplasia or malignancy.  After considering the medical and surgical management options given to her for both bleeding and urinary symptoms, the patient has decided to proceed with surgical management.   Past Medical History  OB History: [id number]  H/O infertility, SVB 38  GYN History: menarche  51 YO   LMP  08/07/11 ;  Does not use contraception;  Denies history of abnormal PAP smear,   Last PAP smear 07/2011  Medical History: Hepatitis B (2008), seasonal allergies, peptic ulcer disease, pelvic inflammatory disease  Surgical History:   D & C;   2009  Abdominal Myomectomy;  2010 Right Knee Surgery Denies problems with anesthesia or history of blood transfusions  Family History: Lung Cancer (father), diabetes, hypertension, stroke and asthma  Social History:  Single, Merchandiser, retail with 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 loratadine (CLARITIN) 10 MG tablet Take 10 mg by mouth daily as needed. For allergies      . naphazoline (CLEAR EYES) 0.012 % ophthalmic solution Place 1 drop into both eyes as needed. For allergies        Allergies  Allergen Reactions  . Dairy Aid (Lactase)   . Shellfish Allergy Hives  . Starch   . Cortisone Rash   Denies sensitivity to peanuts, latex or adhesives   ROS: Admits to occasional blurred vision (needs glasses per patient), wears reading glasses, bilateral knee arthralgias, and occasional nausea with loose stools;  Denies headache, dysphagia, tinnitus, dizziness,  chest pain, shortness of breath, vomiting, diarrhea, dysuria, hematuria, pelvic paineasy bruising,  myalgias, skin rashes and except as is mentioned in the history of present illness, patient's review of systems is otherwise negative  Physical Exam    BP 114/72  Pulse 70  Temp 98.2 F (36.8 C)  Resp 16  Ht 5' 7.5" (1.715 m)  Wt 199 lb (90.266 kg)  BMI 30.71 kg/m2  LMP 08/07/2011  Neck: supple without masses or thyromegaly Lungs: clear to auscultation Heart: regular rate and rhythm Abdomen: soft, non-tender and no organomegaly  Pelvic:EGBUS- wnl; vagina-normal rugae; uterus-14-16 weeks size, irregular & tender, limited mobility;  cervix without lesions or motion tenderness; adnexae-no tenderness or masses Extremities:  no clubbing, cyanosis or edema   Assesment: Menorrhagia                      Mixed Urinary Incontinence                      Uterine  Fibroids   Disposition:  A discussion was held with patient regarding the indication for her procedure(s) along with the risks, which include but are not limited to: reaction to anesthesia, damage to adjacent organs, infection, pelvic prolapse, early menopause, erosion of tension free vaginal tape, worsening of urinary symptoms, need for an open abdominal incision and excessive bleeding. Patient was given a Miralax bowel prep to be completed 24 hours prior to surgery.  Patient verbalized understanding of these risks and preoperative instructions and has consented to proceed with a total laparoscopic hysterectomy with possible open abdominal hysterectomy with placement of tension free vaginal tape at [facility] 09/20/11 @ 9:30 a.m.   CSN# [id number]   Saahas Hidrogo J. Lowell Guitar, PA-C  for Dr. Woodroe Mode. Su Hilt

## 2011-09-06 NOTE — Patient Instructions (Addendum)
20 Sandra Hawkins  09/06/2011   Your procedure is scheduled on:  09/20/11  Enter through the Main Entrance of [facility] at 8 AM.  Pick up the phone at the desk and dial [phone].   Call this number if you have problems the morning of surgery: [phone]   Remember:   Do not eat food:After Midnight.  Do not drink clear liquids: After Midnight.  Take these medicines the morning of surgery with A SIP OF WATER: NA   Do not wear jewelry, make-up or nail polish.  Do not wear lotions, powders, or perfumes. You may wear deodorant.  Do not shave 48 hours prior to surgery.  Do not bring valuables to the hospital.  Contacts, dentures or bridgework may not be worn into surgery.  Leave suitcase in the car. After surgery it may be brought to your room.  For patients admitted to the hospital, checkout time is 11:00 AM the day of discharge.   Patients discharged the day of surgery will not be allowed to drive home.  Name and phone number of your driver: NA  Special Instructions: CHG Shower Use Special Wash: 1/2 bottle night before surgery and 1/2 bottle morning of surgery.   Please read over the following fact sheets that you were given: MRSA Information

## 2011-09-07 NOTE — H&P (Signed)
Sandra Hawkins is a 51 y.o. female G3P0021 who presents for a total laparoscopic hysterectomy along with placement of tension free vaginal tape because of symptomatic uterine fibroid, menorrhagia and mixed urinary incontinence.  Patient underwent an abdominal myomectomy in 2009 with good restoration of a normal cycle and flow however, over the past year her menses has gotten progressively worse.  Patient state her flow will last for 7 day  and cause her to change her heavy flow pad every 1-1.5 hours.  She has on occasion soiled her clothes and passes moderate clots. Her cramping does not require analgesia but will bleed, just like her period, at other times during the month for the same 7 day duration.  She admits to dyspareunia, urinary urgency, leaking of urine with and without val salva maneuver, nocturia and frequency.  She denies however, vaginitis symptoms, dysuria, flank pain or hematuria.  Patient was given Provera 10 mg daily for her bleeding and Detrol LA for urinary symptoms however, she did not fill the prescription for either.  She underwent urodynamic studies in June of this year with results being consistent with mixed urinary incontinence.  Pelvic ultrasound (08/20/11) uterus 9.3 x 6.6 x 5.4 cm with 5 fibroids (largest 1.9 cm) with normal appearing ovaries. Endometrial biopsy performed 08/09/11 showed benign polyp without evidence of atypia, hyperplasia or malignancy.  After considering the medical and surgical management options given to her for both bleeding and urinary symptoms, the patient has decided to proceed with surgical management.   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 loratadine (CLARITIN) 10 MG tablet Take 10 mg by mouth daily as needed. For allergies      . naphazoline (CLEAR EYES) 0.012 % ophthalmic solution Place 1 drop into both eyes as needed. For allergies        Allergies  Allergen Reactions  . Dairy Aid (Lactase)   . Shellfish Allergy Hives  . Starch   . Cortisone Rash   Denies sensitivity to peanuts, latex or adhesives   ROS: Admits to occasional blurred vision (needs glasses per patient), wears reading glasses, bilateral knee arthralgias, and occasional nausea with loose stools;  Denies headache, dysphagia, tinnitus, dizziness,  chest pain, shortness of breath, vomiting, diarrhea, dysuria, hematuria, pelvic paineasy bruising,  myalgias, skin rashes and except as is mentioned in the history of present illness, patient's review of systems is otherwise negative  Physical Exam    BP 114/72  Pulse 70  Temp 98.2 F (36.8 C)  Resp 16  Ht 5' 7.5" (1.715 m)  Wt 199 lb (90.266 kg)  BMI 30.71 kg/m2  LMP 08/07/2011  Neck: supple without masses or thyromegaly Lungs: clear to auscultation Heart: regular rate and rhythm Abdomen: soft, non-tender and no organomegaly   Pelvic:EGBUS- wnl; vagina-normal rugae; uterus-14-16 weeks size, irregular & tender, limited mobility;  cervix without lesions or motion tenderness; adnexae-no tenderness or masses Extremities:  no clubbing, cyanosis or edema   Assesment: Menorrhagia                      Mixed Urinary Incontinence                      Uterine  Fibroids   Disposition:  A discussion was held with patient regarding the indication for her procedure(s) along with the risks, which include but are not limited to: reaction to anesthesia, damage to adjacent organs, infection, pelvic prolapse, early menopause, erosion of tension free vaginal tape, worsening of urinary symptoms, need for an open abdominal incision and excessive bleeding. Patient was given a Miralax bowel prep to be completed 24 hours prior to surgery.  Patient verbalized understanding of these risks and preoperative instructions and has consented to proceed with a total laparoscopic hysterectomy with possible open abdominal hysterectomy with placement of tension free vaginal tape at [facility] 09/20/11 @ 9:30 a.m.   CSN# [id number]   Niobe Dick J. Emmeline Winebarger, PA-C  for Dr. Angela Y. Roberts   

## 2011-09-07 NOTE — H&P (Signed)
Sandra Hawkins is a 51 y.o. female G3P0021 who presents for a total laparoscopic hysterectomy along with placement of tension free vaginal tape because of symptomatic uterine fibroid, menorrhagia and mixed urinary incontinence.  Patient underwent an abdominal myomectomy in 2009 with good restoration of a normal cycle and flow however, over the past year her menses has gotten progressively worse.  Patient state her flow will last for 7 day  and cause her to change her heavy flow pad every 1-1.5 hours.  She has on occasion soiled her clothes and passes moderate clots. Her cramping does not require analgesia but will bleed, just like her period, at other times during the month for the same 7 day duration.  She admits to dyspareunia, urinary urgency, leaking of urine with and without val salva maneuver, nocturia and frequency.  She denies however, vaginitis symptoms, dysuria, flank pain or hematuria.  Patient was given Provera 10 mg daily for her bleeding and Detrol LA for urinary symptoms however, she did not fill the prescription for either.  She underwent urodynamic studies in June of this year with results being consistent with mixed urinary incontinence.  Pelvic ultrasound (08/20/11) uterus 9.3 x 6.6 x 5.4 cm with 5 fibroids (largest 1.9 cm) with normal appearing ovaries. Endometrial biopsy performed 08/09/11 showed benign polyp without evidence of atypia, hyperplasia or malignancy.  After considering the medical and surgical management options given to her for both bleeding and urinary symptoms, the patient has decided to proceed with surgical management.   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 loratadine (CLARITIN) 10 MG tablet Take 10 mg by mouth daily as needed. For allergies      . naphazoline (CLEAR EYES) 0.012 % ophthalmic solution Place 1 drop into both eyes as needed. For allergies        Allergies  Allergen Reactions  . Dairy Aid (Lactase)   . Shellfish Allergy Hives  . Starch   . Cortisone Rash   Denies sensitivity to peanuts, latex or adhesives   ROS: Admits to occasional blurred vision (needs glasses per patient), wears reading glasses, bilateral knee arthralgias, and occasional nausea with loose stools;  Denies headache, dysphagia, tinnitus, dizziness,  chest pain, shortness of breath, vomiting, diarrhea, dysuria, hematuria, pelvic paineasy bruising,  myalgias, skin rashes and except as is mentioned in the history of present illness, patient's review of systems is otherwise negative  Physical Exam    BP 114/72  Pulse 70  Temp 98.2 F (36.8 C)  Resp 16  Ht 5' 7.5" (1.715 m)  Wt 199 lb (90.266 kg)  BMI 30.71 kg/m2  LMP 08/07/2011  Neck: supple without masses or thyromegaly Lungs: clear to auscultation Heart: regular rate and rhythm Abdomen: soft, non-tender and no organomegaly   Pelvic:EGBUS- wnl; vagina-normal rugae; uterus-14-16 weeks size, irregular & tender, limited mobility;  cervix without lesions or motion tenderness; adnexae-no tenderness or masses Extremities:  no clubbing, cyanosis or edema   Assesment: Menorrhagia                      Mixed Urinary Incontinence                      Uterine  Fibroids   Disposition:  A discussion was held with patient regarding the indication for her procedure(s) along with the risks, which include but are not limited to: reaction to anesthesia, damage to adjacent organs, infection, pelvic prolapse, early menopause, erosion of tension free vaginal tape, worsening of urinary symptoms, need for an open abdominal incision and excessive bleeding. Patient was given a Miralax bowel prep to be completed 24 hours prior to surgery.  Patient verbalized understanding of these risks and preoperative instructions and has consented to proceed with a total laparoscopic hysterectomy with possible open abdominal hysterectomy with placement of tension free vaginal tape at [facility] 09/20/11 @ 9:30 a.m.   CSN# [id number]   Puneet Masoner J. Kayle Correa, PA-C  for Dr. Angela Y. Roberts   

## 2011-09-09 ENCOUNTER — Other Ambulatory Visit: Payer: Self-pay | Admitting: Obstetrics and Gynecology

## 2011-09-19 MED ORDER — DEXTROSE 5 % IV SOLN
2.0000 g | INTRAVENOUS | Status: AC
  Administered 2011-09-20: 2 g via INTRAVENOUS
  Filled 2011-09-19: qty 2

## 2011-09-20 ENCOUNTER — Ambulatory Visit (HOSPITAL_COMMUNITY): Payer: 59 | Admitting: Anesthesiology

## 2011-09-20 ENCOUNTER — Encounter (HOSPITAL_COMMUNITY): Payer: Self-pay | Admitting: Anesthesiology

## 2011-09-20 ENCOUNTER — Encounter (HOSPITAL_COMMUNITY)
Admission: RE | Disposition: A | Discharge status: Home / Self Care | Payer: Self-pay | Source: Ambulatory Visit | Attending: Obstetrics and Gynecology

## 2011-09-20 ENCOUNTER — Encounter (HOSPITAL_COMMUNITY): Payer: Self-pay | Admitting: *Deleted

## 2011-09-20 ENCOUNTER — Ambulatory Visit (HOSPITAL_COMMUNITY)
Admission: RE | Admit: 2011-09-20 | Discharge: 2011-09-21 | Disposition: A | Discharge status: Home / Self Care | Payer: 59 | Source: Ambulatory Visit | Attending: Obstetrics and Gynecology | Admitting: Obstetrics and Gynecology

## 2011-09-20 DIAGNOSIS — D259 Leiomyoma of uterus, unspecified: Secondary | ICD-10-CM | POA: Insufficient documentation

## 2011-09-20 DIAGNOSIS — N920 Excessive and frequent menstruation with regular cycle: Principal | ICD-10-CM | POA: Insufficient documentation

## 2011-09-20 DIAGNOSIS — N3946 Mixed incontinence: Secondary | ICD-10-CM | POA: Insufficient documentation

## 2011-09-20 DIAGNOSIS — N393 Stress incontinence (female) (male): Secondary | ICD-10-CM

## 2011-09-20 HISTORY — PX: BLADDER SUSPENSION: SHX72

## 2011-09-20 HISTORY — PX: LAPAROSCOPIC HYSTERECTOMY: SHX1926

## 2011-09-20 HISTORY — PX: CYSTOSCOPY: SHX5120

## 2011-09-20 SURGERY — HYSTERECTOMY, TOTAL, LAPAROSCOPIC
Anesthesia: General | Site: Urethra | Wound class: Clean Contaminated

## 2011-09-20 MED ORDER — HYDROMORPHONE HCL PF 1 MG/ML IJ SOLN
0.2500 mg | INTRAMUSCULAR | Status: DC | PRN
  Administered 2011-09-20 (×4): 0.5 mg via INTRAVENOUS

## 2011-09-20 MED ORDER — LORATADINE 10 MG PO TABS
10.0000 mg | ORAL_TABLET | Freq: Every day | ORAL | Status: DC | PRN
  Filled 2011-09-20: qty 1

## 2011-09-20 MED ORDER — INDIGOTINDISULFONATE SODIUM 8 MG/ML IJ SOLN: INTRAMUSCULAR | Status: DC | PRN

## 2011-09-20 MED ORDER — PROPOFOL 10 MG/ML IV EMUL
INTRAVENOUS | Status: DC | PRN
  Administered 2011-09-20: 200 mg via INTRAVENOUS

## 2011-09-20 MED ORDER — VASOPRESSIN 20 UNIT/ML IJ SOLN
INTRAVENOUS | Status: DC | PRN
  Administered 2011-09-20: 15:00:00 via INTRAMUSCULAR

## 2011-09-20 MED ORDER — ONDANSETRON HCL 4 MG/2ML IJ SOLN
INTRAMUSCULAR | Status: AC
  Filled 2011-09-20: qty 2

## 2011-09-20 MED ORDER — SODIUM CHLORIDE 0.9 % IJ SOLN: 9.0000 mL | INTRAMUSCULAR | Status: DC | PRN

## 2011-09-20 MED ORDER — GLYCOPYRROLATE 0.2 MG/ML IJ SOLN
INTRAMUSCULAR | Status: DC | PRN
  Administered 2011-09-20: 0.2 mg via INTRAVENOUS

## 2011-09-20 MED ORDER — LACTATED RINGERS IR SOLN
Status: DC | PRN
  Administered 2011-09-20: 3000 mL
  Administered 2011-09-20: 1500 mL
  Administered 2011-09-20: 3000 mL

## 2011-09-20 MED ORDER — ROCURONIUM BROMIDE 50 MG/5ML IV SOLN
INTRAVENOUS | Status: AC
  Filled 2011-09-20: qty 1

## 2011-09-20 MED ORDER — HYDROMORPHONE HCL PF 1 MG/ML IJ SOLN
INTRAMUSCULAR | Status: AC
  Administered 2011-09-20: 0.5 mg via INTRAVENOUS
  Filled 2011-09-20: qty 1

## 2011-09-20 MED ORDER — FENTANYL CITRATE 0.05 MG/ML IJ SOLN
INTRAMUSCULAR | Status: AC
  Filled 2011-09-20: qty 5

## 2011-09-20 MED ORDER — KETOROLAC TROMETHAMINE 30 MG/ML IJ SOLN
INTRAMUSCULAR | Status: AC
  Administered 2011-09-20: 30 mg via INTRAVENOUS
  Filled 2011-09-20: qty 1

## 2011-09-20 MED ORDER — DIPHENHYDRAMINE HCL 50 MG/ML IJ SOLN
12.5000 mg | Freq: Four times a day (QID) | INTRAMUSCULAR | Status: DC | PRN
  Administered 2011-09-21: 12.5 mg via INTRAVENOUS
  Filled 2011-09-20: qty 1

## 2011-09-20 MED ORDER — INDIGOTINDISULFONATE SODIUM 8 MG/ML IJ SOLN
INTRAMUSCULAR | Status: AC
  Filled 2011-09-20: qty 5

## 2011-09-20 MED ORDER — LIDOCAINE HCL (CARDIAC) 20 MG/ML IV SOLN
INTRAVENOUS | Status: DC | PRN
  Administered 2011-09-20: 60 mg via INTRAVENOUS

## 2011-09-20 MED ORDER — MIDAZOLAM HCL 5 MG/5ML IJ SOLN
INTRAMUSCULAR | Status: DC | PRN
  Administered 2011-09-20: 2 mg via INTRAVENOUS

## 2011-09-20 MED ORDER — ROCURONIUM BROMIDE 100 MG/10ML IV SOLN
INTRAVENOUS | Status: DC | PRN
  Administered 2011-09-20: 5 mg via INTRAVENOUS
  Administered 2011-09-20: 45 mg via INTRAVENOUS

## 2011-09-20 MED ORDER — HYDROMORPHONE 0.3 MG/ML IV SOLN
INTRAVENOUS | Status: DC
  Administered 2011-09-20: 18:00:00 via INTRAVENOUS
  Administered 2011-09-21: 0.8 mg via INTRAVENOUS
  Filled 2011-09-20: qty 25

## 2011-09-20 MED ORDER — LACTATED RINGERS IV SOLN
INTRAVENOUS | Status: DC
  Administered 2011-09-20 (×2): via INTRAVENOUS
  Administered 2011-09-20: 125 mL/h via INTRAVENOUS
  Administered 2011-09-20 (×2): via INTRAVENOUS

## 2011-09-20 MED ORDER — IBUPROFEN 600 MG PO TABS: 600.0000 mg | ORAL_TABLET | Freq: Four times a day (QID) | ORAL | Status: DC | PRN

## 2011-09-20 MED ORDER — STERILE WATER FOR IRRIGATION IR SOLN
Status: DC | PRN
  Administered 2011-09-20: 1000 mL via INTRAVESICAL

## 2011-09-20 MED ORDER — NEOSTIGMINE METHYLSULFATE 1 MG/ML IJ SOLN
INTRAMUSCULAR | Status: AC
  Filled 2011-09-20: qty 10

## 2011-09-20 MED ORDER — INDIGOTINDISULFONATE SODIUM 8 MG/ML IJ SOLN
INTRAMUSCULAR | Status: DC | PRN
  Administered 2011-09-20 (×2): 2.5 mL via INTRAVENOUS

## 2011-09-20 MED ORDER — ESTRADIOL 0.1 MG/GM VA CREA
TOPICAL_CREAM | VAGINAL | Status: AC
  Filled 2011-09-20: qty 42.5

## 2011-09-20 MED ORDER — NAPHAZOLINE HCL 0.012 % OP SOLN: 1.0000 [drp] | OPHTHALMIC | Status: DC | PRN

## 2011-09-20 MED ORDER — ONDANSETRON HCL 4 MG/2ML IJ SOLN
INTRAMUSCULAR | Status: DC | PRN
  Administered 2011-09-20: 4 mg via INTRAVENOUS

## 2011-09-20 MED ORDER — GLYCOPYRROLATE 0.2 MG/ML IJ SOLN
INTRAMUSCULAR | Status: AC
  Filled 2011-09-20: qty 2

## 2011-09-20 MED ORDER — NALOXONE HCL 0.4 MG/ML IJ SOLN: 0.4000 mg | INTRAMUSCULAR | Status: DC | PRN

## 2011-09-20 MED ORDER — ESTRADIOL 0.1 MG/GM VA CREA
TOPICAL_CREAM | VAGINAL | Status: DC | PRN
  Administered 2011-09-20: 1 via VAGINAL

## 2011-09-20 MED ORDER — DIPHENHYDRAMINE HCL 12.5 MG/5ML PO ELIX
12.5000 mg | ORAL_SOLUTION | Freq: Four times a day (QID) | ORAL | Status: DC | PRN
  Administered 2011-09-21: 12.5 mg via ORAL
  Filled 2011-09-20: qty 5

## 2011-09-20 MED ORDER — BUPIVACAINE HCL (PF) 0.25 % IJ SOLN
INTRAMUSCULAR | Status: AC
  Filled 2011-09-20: qty 30

## 2011-09-20 MED ORDER — LIDOCAINE HCL (CARDIAC) 20 MG/ML IV SOLN
INTRAVENOUS | Status: AC
  Filled 2011-09-20: qty 5

## 2011-09-20 MED ORDER — BUPIVACAINE HCL (PF) 0.25 % IJ SOLN
INTRAMUSCULAR | Status: DC | PRN
  Administered 2011-09-20: 3 mL
  Administered 2011-09-20: 16 mL

## 2011-09-20 MED ORDER — OXYCODONE-ACETAMINOPHEN 5-325 MG PO TABS
1.0000 | ORAL_TABLET | ORAL | Status: DC | PRN
  Administered 2011-09-21: 2 via ORAL
  Filled 2011-09-20 (×2): qty 2

## 2011-09-20 MED ORDER — VASOPRESSIN 20 UNIT/ML IJ SOLN
INTRAMUSCULAR | Status: AC
  Filled 2011-09-20: qty 1

## 2011-09-20 MED ORDER — ONDANSETRON HCL 4 MG/2ML IJ SOLN
4.0000 mg | Freq: Four times a day (QID) | INTRAMUSCULAR | Status: DC | PRN
  Administered 2011-09-21: 4 mg via INTRAVENOUS
  Filled 2011-09-20: qty 2

## 2011-09-20 MED ORDER — FENTANYL CITRATE 0.05 MG/ML IJ SOLN
INTRAMUSCULAR | Status: DC | PRN
  Administered 2011-09-20: 50 ug via INTRAVENOUS
  Administered 2011-09-20: 100 ug via INTRAVENOUS
  Administered 2011-09-20 (×2): 50 ug via INTRAVENOUS
  Administered 2011-09-20: 100 ug via INTRAVENOUS
  Administered 2011-09-20 (×3): 50 ug via INTRAVENOUS

## 2011-09-20 MED ORDER — PROPOFOL 10 MG/ML IV EMUL
INTRAVENOUS | Status: AC
  Filled 2011-09-20: qty 20

## 2011-09-20 MED ORDER — KETOROLAC TROMETHAMINE 30 MG/ML IJ SOLN
30.0000 mg | Freq: Four times a day (QID) | INTRAMUSCULAR | Status: AC
  Administered 2011-09-20 – 2011-09-21 (×3): 30 mg via INTRAVENOUS
  Filled 2011-09-20 (×3): qty 1

## 2011-09-20 MED ORDER — CEFAZOLIN SODIUM-DEXTROSE 2-3 GM-% IV SOLR
INTRAVENOUS | Status: AC
  Filled 2011-09-20: qty 50

## 2011-09-20 MED ORDER — CEFAZOLIN SODIUM-DEXTROSE 2-3 GM-% IV SOLR
INTRAVENOUS | Status: DC | PRN
  Administered 2011-09-20: 2 g via INTRAVENOUS

## 2011-09-20 MED ORDER — KETOROLAC TROMETHAMINE 30 MG/ML IJ SOLN
30.0000 mg | Freq: Once | INTRAMUSCULAR | Status: AC
  Administered 2011-09-20: 30 mg via INTRAVENOUS

## 2011-09-20 MED ORDER — MIDAZOLAM HCL 2 MG/2ML IJ SOLN
INTRAMUSCULAR | Status: AC
  Filled 2011-09-20: qty 2

## 2011-09-20 SURGICAL SUPPLY — 64 items
ADH SKN CLS APL DERMABOND .7 (GAUZE/BANDAGES/DRESSINGS) ×4
BAG URINE DRAINAGE (UROLOGICAL SUPPLIES) ×2 IMPLANT
BARRIER ADHS 3X4 INTERCEED (GAUZE/BANDAGES/DRESSINGS) IMPLANT
BRR ADH 4X3 ABS CNTRL BYND (GAUZE/BANDAGES/DRESSINGS)
CABLE HIGH FREQUENCY MONO STRZ (ELECTRODE) ×2 IMPLANT
CANISTER SUCTION 2500CC (MISCELLANEOUS) ×5 IMPLANT
CATH FOLEY 2WAY SLVR  5CC 18FR (CATHETERS) ×1
CATH FOLEY 2WAY SLVR 5CC 18FR (CATHETERS) ×7 IMPLANT
CHLORAPREP W/TINT 26ML (MISCELLANEOUS) ×5 IMPLANT
CLOTH BEACON ORANGE TIMEOUT ST (SAFETY) ×5 IMPLANT
CONT PATH 16OZ SNAP LID 3702 (MISCELLANEOUS) ×5 IMPLANT
COVER MAYO STAND STRL (DRAPES) ×5 IMPLANT
COVER TABLE BACK 60X90 (DRAPES) ×5 IMPLANT
DECANTER SPIKE VIAL GLASS SM (MISCELLANEOUS) ×2 IMPLANT
DERMABOND ADVANCED (GAUZE/BANDAGES/DRESSINGS) ×1
DERMABOND ADVANCED .7 DNX12 (GAUZE/BANDAGES/DRESSINGS) ×4 IMPLANT
DISSECTOR SPONGE CHERRY (GAUZE/BANDAGES/DRESSINGS) ×5 IMPLANT
DRAPE HYSTEROSCOPY (DRAPE) ×5 IMPLANT
DRAPE PROXIMA HALF (DRAPES) ×7 IMPLANT
ELECT REM PT RETURN 9FT ADLT (ELECTROSURGICAL) ×5
ELECTRODE REM PT RTRN 9FT ADLT (ELECTROSURGICAL) ×4 IMPLANT
EVACUATOR SMOKE 8.L (FILTER) ×10 IMPLANT
GAUZE PACKING 2X5 YD STERILE (GAUZE/BANDAGES/DRESSINGS) ×2 IMPLANT
GAUZE SPONGE 4X4 16PLY XRAY LF (GAUZE/BANDAGES/DRESSINGS) ×5 IMPLANT
GLOVE BIO SURGEON STRL SZ7.5 (GLOVE) ×12 IMPLANT
GLOVE BIOGEL PI IND STRL 7.5 (GLOVE) ×5 IMPLANT
GLOVE BIOGEL PI INDICATOR 7.5 (GLOVE) ×2
GOWN PREVENTION PLUS LG XLONG (DISPOSABLE) ×15 IMPLANT
HEMOSTAT SURGICEL 4X8 (HEMOSTASIS) ×2 IMPLANT
NDL INSUFFLATION 14GA 120MM (NEEDLE) ×3 IMPLANT
NEEDLE INSUFFLATION 14GA 120MM (NEEDLE) ×5 IMPLANT
NS IRRIG 1000ML POUR BTL (IV SOLUTION) ×5 IMPLANT
OCCLUDER COLPOPNEUMO (BALLOONS) ×5 IMPLANT
PACK LAVH (CUSTOM PROCEDURE TRAY) ×5 IMPLANT
PAD OB MATERNITY 4.3X12.25 (PERSONAL CARE ITEMS) ×9 IMPLANT
PROTECTOR NERVE ULNAR (MISCELLANEOUS) ×5 IMPLANT
SCALPEL HARMONIC ACE (MISCELLANEOUS) ×4 IMPLANT
SCISSORS LAP 5X35 DISP (ENDOMECHANICALS) ×2 IMPLANT
SET CYSTO W/LG BORE CLAMP LF (SET/KITS/TRAYS/PACK) ×5 IMPLANT
SET IRRIG TUBING LAPAROSCOPIC (IRRIGATION / IRRIGATOR) ×7 IMPLANT
SLEEVE Z-THREAD 5X100MM (TROCAR) ×8 IMPLANT
SLING TRANS VAGINAL TAPE (Sling) ×1 IMPLANT
SLING UTERINE/ABD GYNECARE TVT (Sling) ×4 IMPLANT
SUT MNCRL AB 3-0 PS2 27 (SUTURE) ×11 IMPLANT
SUT MNCRL AB 4-0 PS2 18 (SUTURE) ×2 IMPLANT
SUT MON AB 4-0 PS1 27 (SUTURE) ×5 IMPLANT
SUT PDS AB 1 CT1 36 (SUTURE) IMPLANT
SUT PDS AB 1 CTX 36 (SUTURE) ×6 IMPLANT
SUT VIC AB 0 CT1 27 (SUTURE) ×5
SUT VIC AB 0 CT1 27XBRD ANBCTR (SUTURE) ×13 IMPLANT
SUT VIC AB 2-0 SH 27 (SUTURE) ×5
SUT VIC AB 2-0 SH 27XBRD (SUTURE) ×25 IMPLANT
SUT VICRYL 0 TIES 12 18 (SUTURE) ×3 IMPLANT
SUT VICRYL 0 UR6 27IN ABS (SUTURE) ×8 IMPLANT
SYR 50ML LL SCALE MARK (SYRINGE) ×7 IMPLANT
TIP UTERINE 6.7X8CM BLUE DISP (MISCELLANEOUS) ×2 IMPLANT
TOWEL OR 17X24 6PK STRL BLUE (TOWEL DISPOSABLE) ×10 IMPLANT
TRAY FOLEY CATH 14FR (SET/KITS/TRAYS/PACK) ×5 IMPLANT
TROCAR BALLN 12MMX100 BLUNT (TROCAR) ×2 IMPLANT
TROCAR Z-THREAD FIOS 11X100 BL (TROCAR) ×10 IMPLANT
TROCAR Z-THREAD FIOS 5X100MM (TROCAR) ×5 IMPLANT
TUBING FILTER THERMOFLATOR (ELECTROSURGICAL) ×5 IMPLANT
WARMER LAPAROSCOPE (MISCELLANEOUS) ×5 IMPLANT
WATER STERILE IRR 1000ML POUR (IV SOLUTION) ×5 IMPLANT

## 2011-09-20 NOTE — Transfer of Care (Signed)
Immediate Anesthesia Transfer of Care Note  Patient: Sandra Hawkins  Procedure(s) Performed: Procedure(s) (LRB): HYSTERECTOMY TOTAL LAPAROSCOPIC (N/A) TRANSVAGINAL TAPE (TVT) PROCEDURE (N/A) CYSTOSCOPY ()  Patient Location: PACU  Anesthesia Type: General  Level of Consciousness: awake, alert  and oriented  Airway & Oxygen Therapy: Patient Spontanous Breathing and Patient connected to nasal cannula oxygen  Post-op Assessment: Report given to PACU RN and Post -op Vital signs reviewed and stable  Post vital signs: Reviewed and stable  Complications: No apparent anesthesia complications

## 2011-09-20 NOTE — Anesthesia Preprocedure Evaluation (Signed)

## 2011-09-20 NOTE — H&P (View-Only) (Signed)
Sandra Hawkins is a 51 y.o. female G3P0021 who presents for a total laparoscopic hysterectomy along with placement of tension free vaginal tape because of symptomatic uterine fibroid, menorrhagia and mixed urinary incontinence.  Patient underwent an abdominal myomectomy in 2009 with good restoration of a normal cycle and flow however, over the past year her menses has gotten progressively worse.  Patient state her flow will last for 7 day  and cause her to change her heavy flow pad every 1-1.5 hours.  She has on occasion soiled her clothes and passes moderate clots. Her cramping does not require analgesia but will bleed, just like her period, at other times during the month for the same 7 day duration.  She admits to dyspareunia, urinary urgency, leaking of urine with and without val salva maneuver, nocturia and frequency.  She denies however, vaginitis symptoms, dysuria, flank pain or hematuria.  Patient was given Provera 10 mg daily for her bleeding and Detrol LA for urinary symptoms however, she did not fill the prescription for either.  She underwent urodynamic studies in June of this year with results being consistent with mixed urinary incontinence.  Pelvic ultrasound (08/20/11) uterus 9.3 x 6.6 x 5.4 cm with 5 fibroids (largest 1.9 cm) with normal appearing ovaries. Endometrial biopsy performed 08/09/11 showed benign polyp without evidence of atypia, hyperplasia or malignancy.  After considering the medical and surgical management options given to her for both bleeding and urinary symptoms, the patient has decided to proceed with surgical management.   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 loratadine (CLARITIN) 10 MG tablet Take 10 mg by mouth daily as needed. For allergies      . naphazoline (CLEAR EYES) 0.012 % ophthalmic solution Place 1 drop into both eyes as needed. For allergies        Allergies  Allergen Reactions  . Dairy Aid (Lactase)   . Shellfish Allergy Hives  . Starch   . Cortisone Rash   Denies sensitivity to peanuts, latex or adhesives   ROS: Admits to occasional blurred vision (needs glasses per patient), wears reading glasses, bilateral knee arthralgias, and occasional nausea with loose stools;  Denies headache, dysphagia, tinnitus, dizziness,  chest pain, shortness of breath, vomiting, diarrhea, dysuria, hematuria, pelvic paineasy bruising,  myalgias, skin rashes and except as is mentioned in the history of present illness, patient's review of systems is otherwise negative  Physical Exam    BP 114/72  Pulse 70  Temp 98.2 F (36.8 C)  Resp 16  Ht 5' 7.5" (1.715 m)  Wt 199 lb (90.266 kg)  BMI 30.71 kg/m2  LMP 08/07/2011  Neck: supple without masses or thyromegaly Lungs: clear to auscultation Heart: regular rate and rhythm Abdomen: soft, non-tender and no organomegaly   Pelvic:EGBUS- wnl; vagina-normal rugae; uterus-14-16 weeks size, irregular & tender, limited mobility;  cervix without lesions or motion tenderness; adnexae-no tenderness or masses Extremities:  no clubbing, cyanosis or edema   Assesment: Menorrhagia                      Mixed Urinary Incontinence                      Uterine  Fibroids   Disposition:  A discussion was held with patient regarding the indication for her procedure(s) along with the risks, which include but are not limited to: reaction to anesthesia, damage to adjacent organs, infection, pelvic prolapse, early menopause, erosion of tension free vaginal tape, worsening of urinary symptoms, need for an open abdominal incision and excessive bleeding. Patient was given a Miralax bowel prep to be completed 24 hours prior to surgery.  Patient verbalized understanding of these risks and preoperative instructions and has consented to proceed with a total laparoscopic hysterectomy with possible open abdominal hysterectomy with placement of tension free vaginal tape at [facility] 09/20/11 @ 9:30 a.m.   CSN# [id number]   Azrielle Springsteen J. Dashia Caldeira, PA-C  for Dr. Angela Y. Roberts   

## 2011-09-20 NOTE — Op Note (Signed)
Preop Diagnosis: Menorrhagia, Symptomatic Fibroids, Mixed Incontinence   Postop Diagnosis: Menorrhagia, Symptomatic Fibroids, Mixed Incontinence   Procedure: HYSTERECTOMY TOTAL LAPAROSCOPIC and LOA TRANSVAGINAL TAPE (TVT) PROCEDURE CYSTOSCOPY   Anesthesia: General    Attending: Purcell Nails, MD   Assistant: Jaymes Graff, MD  Findings: Multiple adhesions of bowel to post uterine wall.  Left adnexa adherent to uterine wall and some of rt adnexa as well.  Adhesion from anterior uterine wall to abdominal wall.  The procedure lasted slightly longer than 4hrs.  I thought it was reasonable to proceed.  An additional dose of antibiotics was given.  Pathology: Uterus and cervix wt 170g  Fluids: 3500cc  UOP: 250cc  EBL: 200cc  Complications: None  Procedure: The patient was taken to the operating room, placed under general anesthesia and prepped and draped in the normal sterile fashion. A Foley catheter was placed. The uterus sounded to 9 cm. A weighted speculum and vaginal retractors were placed in the vagina. Tenaculum was placed on the anterior lip of the cervix.  A size 8 cm tip was used and the rumi was placed with 3.5cm metal KOH ring, tip balloon and occluder insufflated. Attention was then turned to the abdomen. A 10 mm infraumbilical incision was made with the scalpel after 5 cc of 25% percent Marcaine was used for local anesthesia. The Veress needle was into the intra-abdominal cavity and pneumoperitoneum achieved.  The Veress needle was removed and 10mm trochar advanced into the intra-abdominal cavity.  Intraabdominal placement was confirmed with the laparoscope.  Two 5 mm trochars were placed in the right and left lower quadrants under direct visualization with the laparoscope and a 10mm trochar placed in the right lower quadrant 2 finger breadths medial and rostral to the 5mm trochar.  LOA was performed with the monopolar scissors as mentioned in the findings. The harmonic  scalpel was used to cauterize and cut the uterine ovarian ligaments bilaterally.   Both round ligaments were cauterized and cut with the harmonic scalpel as well and the bladder flap created with the harmonic scalpel and with LOA using the monopolar scissors and removed away from the uterus. Both uterine arteries were cauterized and cut with harmonic scalpel.  The harmonic was used to circumscribe the [facility] ring and free the uterus and cervix. The uterus was then pulled into the vagina. Both angles were sutured with 1 PDS.  The  remainder of the cuff was sutured with 1 PDS using interrupted stitches until the vaginal cuff was closed.  A total of 5 sutures were used.   Irrigation was performed.  The fascial closure device was used to stitch the fascia at the 10mm RLQ incision with 0 vicryl.  Gas was allowed to leave the abdomen to check for bleeders and all pedicles were seen to be hemostatic and the patient was given indigo carmine.  Cystoscopy was performed and both ureters were seen to efflux indigo carmine without difficulty and no inadvertent bladder injury was noted.  The vagina was inspected and the cuff was noted to be intact.  Attention was then turned back to the abdomen after removing top pair of gloves. The abdomen was reinsufflated with CO2 gas.   The abdomen and pelvis was copiously irrigated and  hemostasis was noted.  The 10 mm port on the patients rt side was closed with 3-0 monocryl via subcuticular stitch.  All trochars were removed under direct visualization using the laparoscope.  The umbilical fascia was reapproximated using 0 vicryl. The two  5 mm incisions were closed with dermabond.  All remaining skin incisions were closed with Dermabond and the 10 mm skin incisions were reinforced using Dermabond.    A weighted speculum was placed in the patient's vagina and the anterior vaginal wall was injected with dilute pitressin at a concentration of 20 units of pitressin in a total of 50cc of normal  saline.  An incision was made in the anterior wall of the vagina for approximately 1cm beneath the midurethra and the underlying tissue was dissected away from the anterior vaginal wall down to the level of the lower symphysis pubis bilaterally. Attention was then turned to the mons pubis where two 5 mm incisions were made 2 fingerbreadths from the midline. The transabdominal guide was then passed through the mons pubis incision on the patient's right down through the space of Retzius and out through the anterior vaginal wall after deflecting the rigid urethral catheter guide to the ipsilateral side. The same was done on the contralateral side. Cystoscopy was performed and no invadvertant bladder injury was noted. The bladder was drained with a Foley while deflecting the rigid urethral catheter guide to the patient's right and the mesh was attached to the transabdominal guide and elevated up through the space of Retzius and out through the incision on the mons pubis on the ipsilateral side. The same was done on the contralateral side. Cystoscopy was performed again and no inadvertant bladder injury was noted. The 23 French Foley was left in the urethra and a large Tresa Endo was placed between the urethra and the mesh in order to leave the mesh slack beneath the midurethra. The mesh was then cut flush with the skin at the mons pubis incisions bilaterally. Indigo carmine had been administered, cystoscopy was performed again and bilateral ureters were noted to efflux without difficulty. The bilateral incisions on the mons pubis were then cleaned and Dermabond applied. The anterior vaginal wall incision was repaired with 2-0 vicryl with interrupted stitches.  Vagina was packed with estrogen soaked vaginal packing.    Sponge lap and needle counts were correct.  The patient tolerated the procedure well and was returned to the PACU in stable condition

## 2011-09-20 NOTE — Anesthesia Postprocedure Evaluation (Signed)
  Anesthesia Post-op Note  Patient: Sandra Hawkins  Procedure(s) Performed: Procedure(s) (LRB): HYSTERECTOMY TOTAL LAPAROSCOPIC (N/A) TRANSVAGINAL TAPE (TVT) PROCEDURE (N/A) CYSTOSCOPY ()  Patient Location: PACU  Anesthesia Type: General  Level of Consciousness: awake, alert  and oriented  Airway and Oxygen Therapy: Patient Spontanous Breathing  Post-op Pain: none  Post-op Assessment: Post-op Vital signs reviewed, Patient's Cardiovascular Status Stable, Respiratory Function Stable, Patent Airway, No signs of Nausea or vomiting and Pain level controlled  Post-op Vital Signs: Reviewed and stable  Complications: No apparent anesthesia complications

## 2011-09-20 NOTE — Progress Notes (Signed)
Day of Surgery Procedure(s) (LRB): HYSTERECTOMY TOTAL LAPAROSCOPIC (N/A) TRANSVAGINAL TAPE (TVT) PROCEDURE (N/A) CYSTOSCOPY ()  Subjective: Patient reports feeling like whe is about to pass out while ambulating with assistance.  Otherwise no complaints.  Felt ok laying down.  No N/V.  Objective: I have reviewed patient's vital signs and intake and output. 350cc UOP  General: alert and no distress Resp: clear to auscultation bilaterally Cardio: regular rate and rhythm GI: soft, app tender, ND, dec BS, incisions c/d/i Extremities: no calf tenderness Vaginal Bleeding: vaginal packing in place  Assessment: s/p Procedure(s) (LRB): HYSTERECTOMY TOTAL LAPAROSCOPIC (N/A) TRANSVAGINAL TAPE (TVT) PROCEDURE (N/A) CYSTOSCOPY (): stable  Plan: Advance diet as tolerated Encourage ambulation labs in AM Pain under control with PCA SCDs to Bil LEs Adequate UOP  LOS: 0 days    Sandra Hawkins Y 09/20/2011, 11:22 PM

## 2011-09-20 NOTE — Anesthesia Procedure Notes (Signed)
Procedure Name: Intubation Date/Time: 09/20/2011 10:18 AM Performed by: Graciela Husbands Pre-anesthesia Checklist: Suction available, Emergency Drugs available, Timeout performed, Patient identified and Patient being monitored Patient Re-evaluated:Patient Re-evaluated prior to inductionOxygen Delivery Method: Circle system utilized Preoxygenation: Pre-oxygenation with 100% oxygen Intubation Type: IV induction Ventilation: Mask ventilation without difficulty and Oral airway inserted - appropriate to patient size Laryngoscope Size: Mac and 3 Grade View: Grade I Tube type: Oral Tube size: 7.0 mm Number of attempts: 1 Airway Equipment and Method: Stylet Placement Confirmation: ETT inserted through vocal cords under direct vision,  breath sounds checked- equal and bilateral and positive ETCO2 Secured at: 21 cm Tube secured with: Tape Dental Injury: Teeth and Oropharynx as per pre-operative assessment

## 2011-09-20 NOTE — Interval H&P Note (Signed)
History and Physical Interval Note:  09/20/2011 8:18 AM  Sandra Hawkins  has presented today for surgery, with the diagnosis of Menorrhagia, Symptomatic Fibroids, Mixed Incontenence  The various methods of treatment have been discussed with the patient and family. After consideration of risks, benefits and other options for treatment, the patient has consented to  Procedure(s) (LRB): HYSTERECTOMY TOTAL LAPAROSCOPIC (N/A) LAPAROSCOPIC ASSISTED VAGINAL HYSTERECTOMY (N/A) HYSTERECTOMY ABDOMINAL (N/A) TRANSVAGINAL TAPE (TVT) PROCEDURE (N/A) as a surgical intervention .  The patient's history has been reviewed, patient examined, no change in status, stable for surgery.  I have reviewed the patient's chart and labs.  Questions were answered to the patient's satisfaction.     Morry Veiga Y  R/B/A discussed with the patient including but not limited to bleeding, infection, injury, risk of mesh erosion and urinary retention.  Pt verbalized understanding.  Consent to be signed and witnessed.

## 2011-09-21 ENCOUNTER — Encounter (HOSPITAL_COMMUNITY): Payer: Self-pay | Admitting: Obstetrics and Gynecology

## 2011-09-21 ENCOUNTER — Other Ambulatory Visit: Payer: Self-pay | Admitting: Obstetrics and Gynecology

## 2011-09-21 LAB — CBC
MCH: 27.3 pg (ref 26.0–34.0)
MCHC: 32.6 g/dL (ref 30.0–36.0)
MCV: 83.8 fL (ref 78.0–100.0)
Platelets: 154 10*3/uL (ref 150–400)
RBC: 3.33 MIL/uL — ABNORMAL LOW (ref 3.87–5.11)

## 2011-09-21 MED ORDER — LACTATED RINGERS IV SOLN
INTRAVENOUS | Status: DC
  Administered 2011-09-21: 02:00:00 via INTRAVENOUS

## 2011-09-21 MED ORDER — IBUPROFEN 600 MG PO TABS: 600.0000 mg | ORAL_TABLET | Freq: Four times a day (QID) | ORAL | Status: AC | PRN

## 2011-09-21 MED ORDER — IBUPROFEN 600 MG PO TABS: 600.0000 mg | ORAL_TABLET | Freq: Four times a day (QID) | ORAL | Status: DC | PRN

## 2011-09-21 MED ORDER — HYDROMORPHONE HCL 2 MG PO TABS
2.0000 mg | ORAL_TABLET | ORAL | Status: DC | PRN
  Administered 2011-09-21: 2 mg via ORAL
  Filled 2011-09-21: qty 1

## 2011-09-21 MED ORDER — HYDROMORPHONE HCL 2 MG PO TABS: 2.0000 mg | ORAL_TABLET | ORAL | Status: AC | PRN

## 2011-09-21 MED ORDER — HYDROMORPHONE HCL 2 MG PO TABS: 2.0000 mg | ORAL_TABLET | ORAL | Status: DC | PRN

## 2011-09-21 MED ORDER — DIPHENHYDRAMINE HCL 50 MG/ML IJ SOLN: 12.5000 mg | Freq: Once | INTRAMUSCULAR | Status: DC

## 2011-09-21 NOTE — Addendum Note (Signed)
Addendum  created 09/21/11 4540 by Shanon Payor, CRNA   Modules edited:Notes Section

## 2011-09-21 NOTE — Progress Notes (Signed)
  Sandra Hawkins is a54 y.o.  [id number]  Post Op Date # 1  Total Laparoscopic Hysterectomy with LOA and TVT  Subjective: Patient is Doing well postoperatively. Patient has Pain is controlled with current analgesics. Medications being used: PCA., dizziness and lightheadedness last evening with ambulation,  has not ambulated today nor gotten out of bed. Has only had ice chips but has ordered regular diet and reports being hungry. Has Foley with clear pale yellow urine. States she "almost" passed flatus but not completely.  Objective: Vital signs in last 24 hours: Temp:  [97.5 F (36.4 C)-98.6 F (37 C)] 98.4 F (36.9 C) (08/13 0625) Pulse Rate:  [57-95] 64  (08/13 0625) Resp:  [10-24] 12  (08/13 0625) BP: (90-131)/(53-75) 90/57 mmHg (08/13 0625) SpO2:  [99 %-100 %] 100 % (08/13 0625) Weight:  [194 lb (87.998 kg)] 194 lb (87.998 kg) (08/12 1820)  Intake/Output from previous day: 08/12 0701 - 08/13 0700 In: 4000 [I.V.:4000] Out: 1825 [Urine:1625] Intake/Output this shift:    Lab 09/21/11 0500  WBC 10.2  HGB 9.1*  HCT 27.9*  PLT 154    No results found for this basename: NA:3,K:3,CL:3,CO2:3,BUN:3,CREATININE:3,CALCIUM:3,LABALBU:3,PROT:3,BILITOT:3,ALKPHOS:3,ALT:3,AST:3,GLUCOSE:3 in the last 168 hours  EXAM: General: alert, cooperative and no distress Resp: clear to auscultation bilaterally Cardio: regular rate and rhythm, S1, S2 normal, no murmur, click, rub or gallop GI: soft, bowel sounds present, incisions intact with Dermabond adherent. Extremities: Homans sign is negative, no sign of DVT Vaginal Bleeding: minimal and vaginal packing with faint pink stain throughout (removed) SCD Hose in place and functioning   Assessment: s/p Procedure(s): HYSTERECTOMY TOTAL LAPAROSCOPIC TRANSVAGINAL TAPE (TVT) PROCEDURE CYSTOSCOPY: stable, progressing well and anemia  Plan: Advance diet Encourage ambulation Advance to PO medication Discontinue IV fluids Discontinue  Foley Consider discharge home later today  LOS: 1 day    POWELL,ELMIRA, PA-C 09/21/2011 7:44 AM  Pt doing well except itching some since last night.  Benadryl now.  Pt ambulating without difficulty, no N/V, tolerating po, voided spont without difficulty, no flatus.  Pt ready to go home.  Her sister will stay with her tonight.  D/C instructions discussed.  Plan to try dilaudid instead of percocet in case the percocet is causing her itching.  If still itching prior to d/c home, RN instructed to call me.

## 2011-09-21 NOTE — Discharge Summary (Signed)
Physician Discharge Summary  Patient ID: Sandra Hawkins MRN: [id number] DOB/AGE: [birth year] 51 y.o.  Admit date: 09/20/2011 Discharge date: 09/21/2011  Admission Diagnoses: Symptomatic Fibroids and Incontinence  Discharge Diagnoses:  Symptomatic Fibroids and Incontinence s/p TLH and TVT  Active Problems:  * No active hospital problems. *    Discharged Condition: good  Hospital Course: Doing well s/p TLH, TVT, Cystoscopy.  Uncomplicated, routine postop course.  Consults: None  Significant Diagnostic Studies: n/a  Treatments: surgery: TLH, TVT, Cystoscopy  Discharge Exam: Blood pressure 95/60, pulse 82, temperature 98.4 F (36.9 C), temperature source Oral, resp. rate 18, height 5' 7.5" (1.715 m), weight 194 lb (87.998 kg), last menstrual period 09/13/2011, SpO2 99.00%. General appearance: alert and no distress Resp: clear to auscultation bilaterally Cardio: regular rate and rhythm GI: normal findings: bowel sounds normal, soft, non-tender and umbilicus normal, nondistended Extremities: extremities normal, atraumatic, no cyanosis or edema and Homans sign is negative, no sign of DVT Incision/Wound: incisions c/d/i  Disposition: stable with d/c to home  Discharge Orders    Future Appointments: Provider: Department: Dept Phone: Center:   10/29/2011 9:00 AM Purcell Nails, MD [facility] [phone] None     Medication List  As of 09/21/2011  1:42 PM   TAKE these medications         HYDROmorphone 2 MG tablet   Commonly known as: DILAUDID   Take 1 tablet (2 mg total) by mouth every 4 (four) hours as needed for pain.      ibuprofen 600 MG tablet   Commonly known as: ADVIL,MOTRIN   Take 1 tablet (600 mg total) by mouth every 6 (six) hours as needed for pain.      loratadine 10 MG tablet   Commonly known as: CLARITIN   Take 10 mg by mouth daily as needed. For allergies      naphazoline 0.012 % ophthalmic solution   Commonly known as: CLEAR EYES   Place 1 drop  into both eyes as needed. For allergies           Follow-up Information    Follow up with Purcell Nails, MD in 6 weeks. (may already be scheduled)    Contact information:   [address] [place] [address] [phone]          Signed: Purcell Nails 09/21/2011, 1:42 PM

## 2011-09-21 NOTE — Anesthesia Postprocedure Evaluation (Signed)
  Anesthesia Post-op Note  Patient: Sandra Hawkins  Procedure(s) Performed: Procedure(s) (LRB): HYSTERECTOMY TOTAL LAPAROSCOPIC (N/A) TRANSVAGINAL TAPE (TVT) PROCEDURE (N/A) CYSTOSCOPY ()  Patient Location: [facility]  Anesthesia Type: General  Level of Consciousness: awake, alert  and oriented  Airway and Oxygen Therapy: Patient Spontanous Breathing  Post-op Pain: none  Post-op Assessment: Post-op Vital signs reviewed and Patient's Cardiovascular Status Stable  Post-op Vital Signs: Reviewed and stable  Complications: No apparent anesthesia complications

## 2011-10-01 ENCOUNTER — Telehealth: Payer: Self-pay | Admitting: Obstetrics and Gynecology

## 2011-10-01 NOTE — Telephone Encounter (Signed)
JACKIE/PAM/GENERAL QUEST.

## 2011-10-04 ENCOUNTER — Telehealth: Payer: Self-pay

## 2011-10-04 NOTE — Telephone Encounter (Signed)
Spoke with pt who states that she has a lot of pain when having BM's now since surgery. No problems otherwise, no fever. Pt states she has stopped taking her pain meds because it was causing her to itch, but she didn't feel like she needed it anyway. Her pain ,she says, is only when she has to have a BM. Her stool is not hard, but she feels "like she can feel her stool forming in the colon" and it is causing a lot of discomfort.  I explained that surgery is a major thing for the body to endure and it is normal for it to take some time for bladder and bowel habits to get back on track afterward. She understands, but still feels that this amt. Of pain is abnl.  She is also requesting a note for work since she has to be out six weeks to recuperate from surgery, and she only has about 4 weeks of personal time that will be paid. She needs to talk to AR about arranging for the other time to be paid as well, if possible. I booked an appt for eval tomorrow with AR to discuss these issues. Melody Comas A

## 2011-10-05 ENCOUNTER — Encounter: Payer: Self-pay | Admitting: Obstetrics and Gynecology

## 2011-10-05 ENCOUNTER — Ambulatory Visit (INDEPENDENT_AMBULATORY_CARE_PROVIDER_SITE_OTHER): Payer: 59 | Admitting: Obstetrics and Gynecology

## 2011-10-05 VITALS — BP 102/60 | Temp 98.5°F | Ht 68.0 in | Wt 192.0 lb

## 2011-10-05 DIAGNOSIS — Z09 Encounter for follow-up examination after completed treatment for conditions other than malignant neoplasm: Secondary | ICD-10-CM

## 2011-10-05 MED ORDER — FLUCONAZOLE 150 MG PO TABS: 150.0000 mg | ORAL_TABLET | Freq: Once | ORAL | Status: AC

## 2011-10-05 MED ORDER — TRAMADOL HCL 50 MG PO TABS: 50.0000 mg | ORAL_TABLET | Freq: Four times a day (QID) | ORAL | Status: DC | PRN

## 2011-10-05 NOTE — Progress Notes (Signed)
C/o GI cramping when taking dilaudid.  Also request note for work.  Denies constipation.  Filed Vitals:   10/05/11 1027  BP: 102/60  Temp: 98.5 F (36.9 C)   ROS: noncontributory  Pelvic exam:  VULVA: normal appearing vulva with no masses, tenderness or lesions,  VAGINA: normal appearing vagina with normal color and discharge, no lesions, thick yellow/brown d/c in vagina ADNEXA: no masses Incisions healing well and app tender, glue still in place  A/P S/p TLH and TVT about 2wks ago D/c dilaudid, change to ultram Diflucan trial Rec inc fiber RTO for routine postop appt Note for work

## 2011-10-06 ENCOUNTER — Telehealth: Payer: Self-pay

## 2011-10-06 NOTE — Telephone Encounter (Signed)
Pt complains that she has not had a bowel movement as yet. Pt saw Dr. Su Hilt on yesterday.  Pt was advised to continue to follow protocol and push fluids. Sandra Hawkins

## 2011-10-13 ENCOUNTER — Telehealth: Payer: Self-pay

## 2011-10-13 NOTE — Telephone Encounter (Signed)
Spoke to pt. She states that Diflucan was supposed to be e- Rx'd to [facility] on the 27th when she was here for POP sx's. They did not have the Rx for pt. I called in Diflucan 150 mg #1 sig: 1 PO once w/ No RF's. Per Caryl Ada, CMA

## 2011-10-18 ENCOUNTER — Other Ambulatory Visit: Payer: Self-pay | Admitting: Obstetrics and Gynecology

## 2011-10-18 DIAGNOSIS — Z1231 Encounter for screening mammogram for malignant neoplasm of breast: Secondary | ICD-10-CM

## 2011-10-20 ENCOUNTER — Other Ambulatory Visit: Payer: Self-pay | Admitting: Obstetrics and Gynecology

## 2011-10-20 DIAGNOSIS — N920 Excessive and frequent menstruation with regular cycle: Secondary | ICD-10-CM

## 2011-10-21 ENCOUNTER — Ambulatory Visit
Admission: RE | Admit: 2011-10-21 | Discharge: 2011-10-21 | Disposition: A | Discharge status: Home / Self Care | Payer: 59 | Source: Ambulatory Visit | Attending: Obstetrics and Gynecology | Admitting: Obstetrics and Gynecology

## 2011-10-21 DIAGNOSIS — Z1231 Encounter for screening mammogram for malignant neoplasm of breast: Secondary | ICD-10-CM

## 2011-10-29 ENCOUNTER — Encounter: Payer: Self-pay | Admitting: Obstetrics and Gynecology

## 2011-10-29 ENCOUNTER — Ambulatory Visit (INDEPENDENT_AMBULATORY_CARE_PROVIDER_SITE_OTHER): Payer: 59 | Admitting: Obstetrics and Gynecology

## 2011-10-29 VITALS — BP 126/80 | Temp 97.1°F | Ht 68.0 in | Wt 191.0 lb

## 2011-10-29 DIAGNOSIS — N898 Other specified noninflammatory disorders of vagina: Secondary | ICD-10-CM

## 2011-10-29 DIAGNOSIS — Z139 Encounter for screening, unspecified: Secondary | ICD-10-CM

## 2011-10-29 DIAGNOSIS — N951 Menopausal and female climacteric states: Secondary | ICD-10-CM

## 2011-10-29 DIAGNOSIS — D259 Leiomyoma of uterus, unspecified: Secondary | ICD-10-CM

## 2011-10-29 LAB — POCT WET PREP (WET MOUNT)
Clue Cells Wet Prep Whiff POC: POSITIVE
Trichomonas Wet Prep HPF POC: NEGATIVE

## 2011-10-29 LAB — CBC
Hemoglobin: 12.3 g/dL (ref 12.0–15.0)
MCH: 27 pg (ref 26.0–34.0)
MCHC: 33.1 g/dL (ref 30.0–36.0)

## 2011-10-29 MED ORDER — TINIDAZOLE 500 MG PO TABS: 2.0000 g | ORAL_TABLET | Freq: Every day | ORAL | Status: DC

## 2011-10-29 NOTE — Progress Notes (Signed)
DATE OF SURGERY:09/20/2011 TYPE OF SURGERY:TLH/TVT PAIN:No VAG BLEEDING: no VAG DISCHARGE: yes NORMAL GI FUNCTN: yes, slight constipation NORMAL GU FUNCTN: yes  C/o d/c and c/o decreased libido have been going on since prior to surgery Filed Vitals:   10/29/11 0943  BP: 126/80  Temp: 97.1 F (36.2 C)   ROS: noncontributory  Pelvic exam:  VULVA: normal appearing vulva with no masses, tenderness or lesions,  VAGINA: normal appearing vagina with normal color and discharge, no lesions, brown d/c with odor  ADNEXA: normal adnexa in size, nontender and no masses. 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 - BENIGN ENDOMETRIUM; NEGATIVE FOR HYPERPLASIA OR MALIGNANCY. - BENIGN UTERINE ADENOMYOSIS. - BENIGN CERVICAL MUCOSA; NEGATIVE FOR INTRAEPITHELIAL LESION OR MALIGNANCY. - UTERINE SEROSAL ADHESIONS.  A/P May resume all nl activites gradually Check FSH and CBC Wet prep - BV - tindamax RTO 74yr

## 2011-11-08 ENCOUNTER — Telehealth: Payer: Self-pay | Admitting: Obstetrics and Gynecology

## 2011-11-08 NOTE — Telephone Encounter (Signed)
Spoke with pt c/o pain with urination pt would like appt 11/15/11 because she is off work that day pt has appt 11/15/11 at 2:00 with AR pt voice understanding

## 2011-11-15 ENCOUNTER — Ambulatory Visit (INDEPENDENT_AMBULATORY_CARE_PROVIDER_SITE_OTHER): Payer: 59 | Admitting: Obstetrics and Gynecology

## 2011-11-15 ENCOUNTER — Encounter: Payer: Self-pay | Admitting: Obstetrics and Gynecology

## 2011-11-15 VITALS — BP 112/64 | HR 76 | Wt 194.0 lb

## 2011-11-15 DIAGNOSIS — R300 Dysuria: Secondary | ICD-10-CM

## 2011-11-15 DIAGNOSIS — R309 Painful micturition, unspecified: Secondary | ICD-10-CM

## 2011-11-15 DIAGNOSIS — N899 Noninflammatory disorder of vagina, unspecified: Secondary | ICD-10-CM

## 2011-11-15 DIAGNOSIS — N898 Other specified noninflammatory disorders of vagina: Secondary | ICD-10-CM

## 2011-11-15 LAB — POCT WET PREP (WET MOUNT)
Bacteria Wet Prep HPF POC: NEGATIVE
Clue Cells Wet Prep Whiff POC: NEGATIVE
WBC, Wet Prep HPF POC: NEGATIVE

## 2011-11-15 LAB — POCT URINALYSIS DIPSTICK
Spec Grav, UA: 1.025
Urobilinogen, UA: NEGATIVE

## 2011-11-15 MED ORDER — NITROFURANTOIN MONOHYD MACRO 100 MG PO CAPS: 100.0000 mg | ORAL_CAPSULE | Freq: Two times a day (BID) | ORAL | Status: DC

## 2011-11-15 NOTE — Progress Notes (Signed)
Pain with urination  Filed Vitals:   11/15/11 1413  BP: 112/64  Pulse: 76    ROS: noncontributory  Pelvic exam:  VULVA: normal appearing vulva with no masses, tenderness or lesions,  VAGINA: normal appearing vagina with normal color and discharge, no lesions, area at cuff about 3-56mm with erythema, brown d/c in vagina, silver nitrate applied to area ADNEXA: normal adnexa in size, nontender and no masses. Bil CVAT  Results for orders placed in visit on 11/15/11  POCT URINALYSIS DIPSTICK      Component Value Range   Color, UA yellow     Clarity, UA       Glucose, UA neg     Bilirubin, UA neg     Ketones, UA neg     Spec Grav, UA 1.025     Blood, UA trace     pH, UA 5.0     Protein, UA trace     Urobilinogen, UA negative     Nitrite, UA neg     Leukocytes, UA moderate (2+)    POCT WET PREP (WET MOUNT)      Component Value Range   Source Wet Prep POC       WBC, Wet Prep HPF POC neg     Bacteria Wet Prep HPF POC neg     BACTERIA WET PREP MORPHOLOGY POC       Clue Cells Wet Prep HPF POC None     CLUE CELLS WET PREP WHIFF POC Negative Whiff     Yeast Wet Prep HPF POC None     KOH Wet Prep POC       Trichomonas Wet Prep HPF POC neg     pH not done      A/P Urine Cx Macrobid for dysuria

## 2011-11-19 ENCOUNTER — Telehealth: Payer: Self-pay

## 2011-11-19 NOTE — Telephone Encounter (Signed)
Spoke to pt re: UCX showing E-coli. Pt needs Cipro 250 mg bid x 3 days. # 6 0 RF's . Called to [facility] on [address] per AR. Pt states she never picked up Macrobid, so pt told me to tell pharmacist to disregard the prev. Prescription. Pt will pick up tonight. Melody Comas A

## 2011-12-13 NOTE — Telephone Encounter (Signed)
Chart note to close encounter only. Sandra Hawkins, Sandra Hawkins  

## 2012-03-16 ENCOUNTER — Ambulatory Visit (INDEPENDENT_AMBULATORY_CARE_PROVIDER_SITE_OTHER): Payer: BC Managed Care – PPO | Admitting: Family Medicine

## 2012-03-16 VITALS — BP 136/80 | HR 95 | Temp 99.4°F | Resp 17 | Ht 67.5 in | Wt 193.0 lb

## 2012-03-16 DIAGNOSIS — J111 Influenza due to unidentified influenza virus with other respiratory manifestations: Secondary | ICD-10-CM

## 2012-03-16 DIAGNOSIS — R05 Cough: Secondary | ICD-10-CM

## 2012-03-16 DIAGNOSIS — R6889 Other general symptoms and signs: Secondary | ICD-10-CM

## 2012-03-16 DIAGNOSIS — R059 Cough, unspecified: Secondary | ICD-10-CM

## 2012-03-16 LAB — POCT CBC
MCH, POC: 26.5 pg — AB (ref 27–31.2)
MCHC: 31.4 g/dL — AB (ref 31.8–35.4)
MID (cbc): 0.5 (ref 0–0.9)
MPV: 10.4 fL (ref 0–99.8)
POC LYMPH PERCENT: 36.5 %L (ref 10–50)
POC MID %: 7.8 %M (ref 0–12)
Platelet Count, POC: 206 10*3/uL (ref 142–424)
RBC: 5.01 M/uL (ref 4.04–5.48)
RDW, POC: 16.1 %
WBC: 6.2 10*3/uL (ref 4.6–10.2)

## 2012-03-16 MED ORDER — MUCINEX DM 30-600 MG PO TB12: 1.0000 | ORAL_TABLET | Freq: Two times a day (BID) | ORAL | Status: DC | PRN

## 2012-03-16 MED ORDER — BENZONATATE 100 MG PO CAPS: 100.0000 mg | ORAL_CAPSULE | Freq: Three times a day (TID) | ORAL | Status: DC | PRN

## 2012-03-16 MED ORDER — IPRATROPIUM BROMIDE 0.03 % NA SOLN: 2.0000 | Freq: Two times a day (BID) | NASAL | Status: DC

## 2012-03-16 NOTE — Progress Notes (Signed)
[address]   [place], [place]  [address]   [phone]  Subjective:    Patient ID: Sandra Hawkins, female    DOB: 10/11/1960, 52 y.o.   MRN: [id number]  HPI This 52 y.o. female presents for evaluation of flu like symptoms.  Onset two days ago.  +feverish; Alkeseltzer Cold Plus.  Cough drops.  +chills/sweats.  +nausea with eating.  No vomiting.  +HA.  No ear pain or sore throat.  +rhinorrhea; +nasal congestion.  +coughing; +sputum production brownish.  +SOB; + diarrhea last night only x4.  +fatigue/malaise.  No flu vaccine.  +tobacco.   Works as Merchandiser, retail with sick employees; works at [employer].  N[place]quil. +body aches  PCP:  [place]  Review of Systems  Constitutional: Positive for fever, chills and fatigue.  HENT: Positive for congestion and rhinorrhea. Negative for ear pain, sore throat and sinus pressure.   Respiratory: Positive for cough and shortness of breath. Negative for wheezing.   Gastrointestinal: Positive for nausea and diarrhea. Negative for vomiting.  Skin: Negative for rash.        Past Medical History  Diagnosis Date  . Fibroid 04/03/2007  . Arthritis     Rheumatoid  . Irregular menses   . H/O varicella   . History of measles, mumps, or rubella   . Monilial vaginitis 2006  . H/O menorrhagia 2007  . Urge incontinence 2008  . Hepatitis B 11/14/2006  . Pain pelvic   . H/O dysmenorrhea 2009  . History of nausea 2009    With menses   . SVD (spontaneous vaginal delivery)     x 1  . Allergy     Past Surgical History  Procedure Date  . Myomectomy abdominal approach 04/03/2007  . Dilation and curettage of uterus   . Knee surgery   . Laparoscopic hysterectomy 09/20/2011    Procedure: HYSTERECTOMY TOTAL LAPAROSCOPIC;  Surgeon: Purcell Nails, MD;  Location: [facility];  Service: Gynecology;  Laterality: N/A;  . Bladder suspension 09/20/2011    Procedure: TRANSVAGINAL TAPE (TVT) PROCEDURE;  Surgeon: Purcell Nails, MD;  Location: [facility];  Service: Gynecology;   Laterality: N/A;  . Cystoscopy 09/20/2011    Procedure: CYSTOSCOPY;  Surgeon: Purcell Nails, MD;  Location: [facility];  Service: Gynecology;;  . Joint replacement     Prior to Admission medications   Medication Sig Start Date End Date Taking? Authorizing Provider  ibuprofen (ADVIL,MOTRIN) 600 MG tablet Take 600 mg by mouth every 6 (six) hours as needed.   Yes Historical Provider, MD  naphazoline (CLEAR EYES) 0.012 % ophthalmic solution Place 1 drop into both eyes as needed. For allergies   Yes Historical Provider, MD  nitrofurantoin, macrocrystal-monohydrate, (MACROBID) 100 MG capsule Take 1 capsule (100 mg total) by mouth 2 (two) times daily. For 5 days. 11/15/11  Yes Purcell Nails, MD  loratadine (CLARITIN) 10 MG tablet Take 10 mg by mouth daily as needed. For allergies    Historical Provider, MD  tinidazole (TINDAMAX) 500 MG tablet Take 4 tablets (2,000 mg total) by mouth daily. For 2 days. 10/29/11   Purcell Nails, MD  traMADol (ULTRAM) 50 MG tablet Take 1 tablet (50 mg total) by mouth every 6 (six) hours as needed for pain. 10/05/11 10/04/12  Purcell Nails, MD    Allergies  Allergen Reactions  . Dairy Aid (Lactase)   . Shellfish Allergy Hives  . Starch   . Cortisone Rash    History   Social  History  . Marital Status: Single    Spouse Name: N/A    Number of Children: N/A  . Years of Education: N/A   Occupational History  . Not on file.   Social History Main Topics  . Smoking status: Former Smoker -- 1.0 packs/day for 3 years    Types: Cigarettes    Quit date: 03/14/2012  . Smokeless tobacco: Never Used  . Alcohol Use: No  . Drug Use: No  . Sexually Active: Yes    Birth Control/ Protection: Surgical     Comment: HYST    Other Topics Concern  . Not on file   Social History Narrative  . No narrative on file    Family History  Problem Relation Age of Onset  . Cancer Father     Lung  . Diabetes Sister   . Asthma Maternal Grandmother   . Diabetes Maternal  Grandmother     Objective:   Physical Exam  Nursing note and vitals reviewed. Constitutional: She is oriented to person, place, and time. She appears well-developed and well-nourished. No distress.  HENT:  Head: Normocephalic and atraumatic.  Right Ear: External ear normal.  Left Ear: External ear normal.  Nose: Nose normal.  Mouth/Throat: Oropharynx is clear and moist.  Eyes: Conjunctivae normal and EOM are normal. Pupils are equal, round, and reactive to light.  Neck: Normal range of motion. Neck supple.  Cardiovascular: Normal rate, regular rhythm and normal heart sounds.   No murmur heard. Pulmonary/Chest: Effort normal and breath sounds normal. No respiratory distress. She has no wheezes. She has no rales.       Frequent hacking cough throughout visit.  Lymphadenopathy:    She has cervical adenopathy.  Neurological: She is alert and oriented to person, place, and time.  Skin: No rash noted. She is not diaphoretic.  Psychiatric: She has a normal mood and affect. Her behavior is normal.   Results for orders placed in visit on 03/16/12  POCT INFLUENZA A/B      Component Value Range   Influenza A, POC Negative     Influenza B, POC Negative    GLUCOSE, POCT (MANUAL RESULT ENTRY)      Component Value Range   POC Glucose 101 (*) 70 - 99 mg/dl  POCT CBC      Component Value Range   WBC 6.2  4.6 - 10.2 K/uL   Lymph, poc 2.3  0.6 - 3.4   POC LYMPH PERCENT 36.5  10 - 50 %L   MID (cbc) 0.5  0 - 0.9   POC MID % 7.8  0 - 12 %M   POC Granulocyte 3.5  2 - 6.9   Granulocyte percent 55.7  37 - 80 %G   RBC 5.01  4.04 - 5.48 M/uL   Hemoglobin 13.3  12.2 - 16.2 g/dL   HCT, POC 16.1  09.6 - 47.9 %   MCV 84.4  80 - 97 fL   MCH, POC 26.5 (*) 27 - 31.2 pg   MCHC 31.4 (*) 31.8 - 35.4 g/dL   RDW, POC 04.5     Platelet Count, POC 206  142 - 424 K/uL   MPV 10.4  0 - 99.8 fL       Assessment & Plan:   1. Flu-like symptoms  POCT Influenza A/B, POCT glucose (manual entry), POCT CBC  2.  Cough      1. Influenza: New. Treat symptomatically with Mucinex DM bid, Tessalon Perles tid, Atrovent nasal spray  bid.  RTC for acute SOB, worsening, persistent fever.  OOW note for 2/6, 2/7, 03/18/12.    Meds ordered this encounter  Medications  . Dextromethorphan-Guaifenesin (MUCINEX DM) 30-600 MG TB12    Sig: Take 1 tablet by mouth 2 (two) times daily as needed.    Dispense:  28 each    Refill:  0  . benzonatate (TESSALON) 100 MG capsule    Sig: Take 1-2 capsules (100-200 mg total) by mouth 3 (three) times daily as needed for cough.    Dispense:  60 capsule    Refill:  0  . ipratropium (ATROVENT) 0.03 % nasal spray    Sig: Place 2 sprays into the nose 2 (two) times daily.    Dispense:  30 mL    Refill:  5

## 2012-03-16 NOTE — Patient Instructions (Addendum)
1. Flu-like symptoms  POCT Influenza A/B, POCT glucose (manual entry), POCT CBC  2. Cough  Dextromethorphan-Guaifenesin (MUCINEX DM) 30-600 MG TB12, benzonatate (TESSALON) 100 MG capsule   Influenza, Adult Influenza ("the flu") is a viral infection of the respiratory tract. It occurs more often in winter months because people spend more time in close contact with one another. Influenza can make you feel very sick. Influenza easily spreads from person to person (contagious). CAUSES  Influenza is caused by a virus that infects the respiratory tract. You can catch the virus by breathing in droplets from an infected person's cough or sneeze. You can also catch the virus by touching something that was recently contaminated with the virus and then touching your mouth, nose, or eyes. SYMPTOMS  Symptoms typically last 4 to 10 days and may include:  Fever.  Chills.  Headache, body aches, and muscle aches.  Sore throat.  Chest discomfort and cough.  Poor appetite.  Weakness or feeling tired.  Dizziness.  Nausea or vomiting. DIAGNOSIS  Diagnosis of influenza is often made based on your history and a physical exam. A nose or throat swab test can be done to confirm the diagnosis. RISKS AND COMPLICATIONS You may be at risk for a more severe case of influenza if you smoke cigarettes, have diabetes, have chronic heart disease (such as heart failure) or lung disease (such as asthma), or if you have a weakened immune system. Elderly people and pregnant women are also at risk for more serious infections. The most common complication of influenza is a lung infection (pneumonia). Sometimes, this complication can require emergency medical care and may be life-threatening. PREVENTION  An annual influenza vaccination (flu shot) is the best way to avoid getting influenza. An annual flu shot is now routinely recommended for all adults in the [place] TREATMENT  In mild cases, influenza goes away on its own.  Treatment is directed at relieving symptoms. For more severe cases, your caregiver may prescribe antiviral medicines to shorten the sickness. Antibiotic medicines are not effective, because the infection is caused by a virus, not by bacteria. HOME CARE INSTRUCTIONS  Only take over-the-counter or prescription medicines for pain, discomfort, or fever as directed by your caregiver.  Use a cool mist humidifier to make breathing easier.  Get plenty of rest until your temperature returns to normal. This usually takes 3 to 4 days.  Drink enough fluids to keep your urine clear or pale yellow.  Cover your mouth and nose when coughing or sneezing, and wash your hands well to avoid spreading the virus.  Stay home from work or school until your fever has been gone for at least 1 full day. SEEK MEDICAL CARE IF:   You have chest pain or a deep cough that worsens or produces more mucus.  You have nausea, vomiting, or diarrhea. SEEK IMMEDIATE MEDICAL CARE IF:   You have difficulty breathing, shortness of breath, or your skin or nails turn bluish.  You have severe neck pain or stiffness.  You have a severe headache, facial pain, or earache.  You have a worsening or recurring fever.  You have nausea or vomiting that cannot be controlled. MAKE SURE YOU:  Understand these instructions.  Will watch your condition.  Will get help right away if you are not doing well or get worse. Document Released: 01/23/2000 Document Revised: 07/27/2011 Document Reviewed: 04/26/2011 [facility] Patient Information 2013 [place], [place].

## 2012-06-26 ENCOUNTER — Other Ambulatory Visit (INDEPENDENT_AMBULATORY_CARE_PROVIDER_SITE_OTHER): Payer: 59

## 2012-06-26 DIAGNOSIS — Z0000 Encounter for general adult medical examination without abnormal findings: Secondary | ICD-10-CM

## 2012-06-26 LAB — HEPATIC FUNCTION PANEL
ALT: 20 U/L (ref 0–35)
AST: 19 U/L (ref 0–37)
Alkaline Phosphatase: 81 U/L (ref 39–117)
Bilirubin, Direct: 0.1 mg/dL (ref 0.0–0.3)
Total Protein: 8.3 g/dL (ref 6.0–8.3)

## 2012-06-26 LAB — CBC WITH DIFFERENTIAL/PLATELET
Basophils Absolute: 0 10*3/uL (ref 0.0–0.1)
Eosinophils Absolute: 0.1 10*3/uL (ref 0.0–0.7)
Hemoglobin: 13.9 g/dL (ref 12.0–15.0)
Lymphocytes Relative: 28.8 % (ref 12.0–46.0)
MCHC: 33.5 g/dL (ref 30.0–36.0)
Monocytes Relative: 5.9 % (ref 3.0–12.0)
Neutrophils Relative %: 64 % (ref 43.0–77.0)
Platelets: 194 10*3/uL (ref 150.0–400.0)
RDW: 16 % — ABNORMAL HIGH (ref 11.5–14.6)

## 2012-06-26 LAB — BASIC METABOLIC PANEL
CO2: 26 mEq/L (ref 19–32)
Chloride: 102 mEq/L (ref 96–112)
Potassium: 3.9 mEq/L (ref 3.5–5.1)
Sodium: 135 mEq/L (ref 135–145)

## 2012-06-26 LAB — POCT URINALYSIS DIPSTICK
Bilirubin, UA: NEGATIVE
Blood, UA: NEGATIVE
Glucose, UA: NEGATIVE
Nitrite, UA: NEGATIVE
Spec Grav, UA: >=1.03
Urobilinogen, UA: 0.2
pH, UA: 5.5

## 2012-06-26 LAB — LIPID PANEL
Cholesterol: 162 mg/dL (ref 0–200)
HDL: 55.7 mg/dL (ref >39.00)
LDL Cholesterol: 88 mg/dL (ref 0–99)
Total CHOL/HDL Ratio: 3
Triglycerides: 91 mg/dL (ref 0.0–149.0)

## 2012-07-06 ENCOUNTER — Encounter: Payer: Self-pay | Admitting: Family Medicine

## 2012-07-06 ENCOUNTER — Ambulatory Visit (INDEPENDENT_AMBULATORY_CARE_PROVIDER_SITE_OTHER): Payer: 59 | Admitting: Family Medicine

## 2012-07-06 VITALS — BP 100/70 | HR 72 | Temp 98.5°F | Resp 12 | Ht 67.0 in | Wt 204.0 lb

## 2012-07-06 DIAGNOSIS — Z0000 Encounter for general adult medical examination without abnormal findings: Secondary | ICD-10-CM

## 2012-07-06 DIAGNOSIS — M546 Pain in thoracic spine: Secondary | ICD-10-CM

## 2012-07-06 MED ORDER — METHOCARBAMOL 500 MG PO TABS: ORAL_TABLET | ORAL | Status: DC

## 2012-07-06 NOTE — Progress Notes (Signed)
Subjective:    Patient ID: Sandra Hawkins, female    DOB: Oct 24, 1960, 52 y.o.   MRN: [id number]  HPI  Patient here for complete physical. She had hysterectomy within the past year along with reported bladder tack. She's had some reported discomfort from mesh that was placed and she is requesting that this be removed. She is encouraged to followup with her surgeon regarding this issue.  She had tetanus back in 2012. Last mammogram September 2013 Never had screening colonoscopy.  Patient has acute issue of 2 day history of mid to upper thoracic back pain. No injury. Dull ache to occasional sharp pain. No chest pain. No dyspnea. Has not taken any medications. Worse with movement  Previous right total knee replacement. This limits some her exercise. She's had some gradual weight gain in recent years  Past Medical History  Diagnosis Date  . Fibroid 04/03/2007  . Arthritis     Rheumatoid  . Irregular menses   . H/O varicella   . History of measles, mumps, or rubella   . Monilial vaginitis 2006  . H/O menorrhagia 2007  . Urge incontinence 2008  . Hepatitis B 11/14/2006  . Pain pelvic   . H/O dysmenorrhea 2009  . History of nausea 2009    With menses   . SVD (spontaneous vaginal delivery)     x 1  . Allergy    Past Surgical History  Procedure Laterality Date  . Myomectomy abdominal approach  04/03/2007  . Dilation and curettage of uterus    . Knee surgery    . Laparoscopic hysterectomy  09/20/2011    Procedure: HYSTERECTOMY TOTAL LAPAROSCOPIC;  Surgeon: Purcell Nails, MD;  Location: [facility];  Service: Gynecology;  Laterality: N/A;  . Bladder suspension  09/20/2011    Procedure: TRANSVAGINAL TAPE (TVT) PROCEDURE;  Surgeon: Purcell Nails, MD;  Location: [facility];  Service: Gynecology;  Laterality: N/A;  . Cystoscopy  09/20/2011    Procedure: CYSTOSCOPY;  Surgeon: Purcell Nails, MD;  Location: [facility];  Service: Gynecology;;  . Joint replacement      reports that she quit  smoking about 3 months ago. Her smoking use included Cigarettes. She has a 3 pack-year smoking history. She has never used smokeless tobacco. She reports that she does not drink alcohol or use illicit drugs. family history includes Asthma in her maternal grandmother; Cancer in her father; and Diabetes in her maternal grandmother and sister. Allergies  Allergen Reactions  . Dairy Aid (Lactase)   . Shellfish Allergy Hives  . Starch   . Cortisone Rash     Review of Systems  Constitutional: Negative for fever, activity change, appetite change, fatigue and unexpected weight change.  HENT: Negative for hearing loss, ear pain, sore throat and trouble swallowing.   Eyes: Negative for visual disturbance.  Respiratory: Negative for cough and shortness of breath.   Cardiovascular: Negative for chest pain and palpitations.  Gastrointestinal: Negative for abdominal pain, diarrhea, constipation and blood in stool.  Endocrine: Negative for polydipsia and polyuria.  Genitourinary: Negative for dysuria and hematuria.  Musculoskeletal: Positive for arthralgias. Negative for myalgias and back pain.  Skin: Negative for rash.  Neurological: Negative for dizziness, syncope and headaches.  Hematological: Negative for adenopathy.  Psychiatric/Behavioral: Negative for confusion and dysphoric mood.       Objective:   Physical Exam  Constitutional: She is oriented to person, place, and time. She appears well-developed and well-nourished.  HENT:  Head: Normocephalic and atraumatic.  Eyes:  EOM are normal. Pupils are equal, round, and reactive to light.  Neck: Normal range of motion. Neck supple. No thyromegaly present.  Cardiovascular: Normal rate, regular rhythm and normal heart sounds.   No murmur heard. Pulmonary/Chest: Breath sounds normal. No respiratory distress. She has no wheezes. She has no rales.  Abdominal: Soft. Bowel sounds are normal. She exhibits no distension and no mass. There is no  tenderness. There is no rebound and no guarding.  Genitourinary:  Per gyn  Musculoskeletal: Normal range of motion. She exhibits no edema.  Just some nonspecific muscular tenderness upper thoracic region bilaterally. No specific spinal tenderness  Lymphadenopathy:    She has no cervical adenopathy.  Neurological: She is alert and oriented to person, place, and time. She displays normal reflexes. No cranial nerve deficit.  Skin: No rash noted.  Psychiatric: She has a normal mood and affect. Her behavior is normal. Judgment and thought content normal.          Assessment & Plan:  Complete physical. Immunizations up-to-date. Schedule screening colonoscopy. Patient will continue GYN followup, especially to discuss issues of mesh that was placed with recent hysterectomy and bladder tack surgery.  We discussed lifestyle strategies to help with weight loss.  Mid thoracic back pain. Nonfocal exam. Try short-term Advil or Aleve. Robaxin 500 mg at night when necessary. Try topical heat and topical rubs prn. Touch base in 2 weeks if pain not improving

## 2012-07-06 NOTE — Patient Instructions (Addendum)
Try heat and topical rub such as Biofreeze to back Try over the counter anti inflammatory such as Advil or Aleve. Use muscle relaxer at night. We will set up colonoscopy screening

## 2012-09-20 ENCOUNTER — Other Ambulatory Visit: Payer: Self-pay | Admitting: Obstetrics and Gynecology

## 2012-09-22 ENCOUNTER — Encounter (HOSPITAL_COMMUNITY): Payer: Self-pay | Admitting: *Deleted

## 2012-10-23 ENCOUNTER — Other Ambulatory Visit: Payer: Self-pay | Admitting: Obstetrics and Gynecology

## 2012-11-02 ENCOUNTER — Other Ambulatory Visit: Payer: Self-pay | Admitting: Obstetrics and Gynecology

## 2012-11-02 NOTE — H&P (Signed)
Sandra Hawkins is a 52 y.o.  female,  P 1-0-2-1 presents for removal of tension free vaginal tape mesh because of associated pelvic pain.  The patient underwent a total laparoscopic hysterectomy with placement of tension free vaginal tape 09/20/2011.  Approximately 4 months after the bladder sling procedure she developed sharp shooting pains from her vaginal area up to her upper abdomen.  This would primarily happen when she voided but would also occur with the passing of flatus and bowel movements.  She goes on to report dyspareunia but denies any bleeding, purulent drainage or incontinence.  The patient believes strongly that her symptoms are related to the vaginal mesh and wants to have it removed.  She understands that relief of her symptoms cannot be guaranteed with removal of the tape and that the incontinence it was placed to treat, may return.  She verbalized understanding of these risks, along with risks of anesthetic complication, injury to adjacent organs, infection and excessive bleeding and would like to proceed with removal of vaginal mesh.  Past Medical History  OB History: G3:  P 1-0-2-1;   SVB in 1983;  History of infertility   GYN History: menarche: 52 YO;  S/P Hysterectomy;   The patient denies history of sexually transmitted disease.  Denies history of abnormal PAP smear  Last PAP smear: 2013  Medical History: Hepatitis B (2008), peptic ulcer disease, pelvic inflammatory disease  Surgical History:  2013: Total Laparoscopic Hysterectomy with Placement of Tension Free Vaginal Tape;  2010: Right Knee Replacement;  2009: Myomectomy; 2009:  D & C                   Denies problems with anesthesia or history of blood transfusions  Family History:  Lung cancer, diabetes, stroke, hypertension, depression and asthma.  Social History: Divorced and employed with the [employer]; Denies alcohol or illicit drug use but is in the process of cigarette smoking  cessation   Medications:  Multivitamins daily  Allergies  Allergen Reactions  . Dairy Aid [Lactase] Other (See Comments)    bloats  . Shellfish Allergy Hives  . Starch     bloats  . Cortisone Rash   Denies sensitivity to peanuts,  latex or adhesives.  ROS: Admits to glasses for reading,  periodic bilateral knee swelling/stiffness,  but  denies headache, vision changes, nasal congestion, dysphagia, tinnitus, dizziness, hoarseness, cough,  chest pain, shortness of breath, nausea, vomiting, diarrhea,constipation,  urinary frequency, urgency  dysuria, hematuria, vaginitis symptoms, easy bruising,  myalgias,  skin rashes, unexplained weight loss and except as is mentioned in the history of present illness, patient's review of systems is otherwise negative.   Physical Exam  Bp :104/70   P:72    R:12     Temperature: 98.2 degrees F oraly    Weight: 203 lbs    Height: 5' 7.5"    BMI: 31.3  Neck: supple without masses or thyromegaly Lungs: clear to auscultation Heart: regular rate and rhythm Abdomen: soft, non-tender and no organomegaly Pelvic:EGBUS- wnl; vagina-normal with tenderness at upper introitus; uterus/cervix-surgically absent adnexae-no tenderness or masses Extremities:  no clubbing, cyanosis or edema  Assesment:  Dysuria                       Pelvic Pain                      S/P Placement of Tension Free Vaginal Tape  Disposition:  A discussion was held with the patient regarding her procedure,along with the risks that include:isks of anesthetic complication, injury to adjacent organs, infection and excessive bleeding.  She further understands that relief of her symptoms cannot be guaranteed with removal of the tape and that with removal her incontinence may return.  She verbalized understanding of these risks and has consented to undergo Removal of Vaginal Mesh, on November 10, 2012 at 9:30 a.m.-[facility].  CSN# [id number]   Mardee Clune J. Lowell Guitar, PA-C   for Dr. Woodroe Mode. Su Hilt

## 2012-11-10 ENCOUNTER — Encounter (HOSPITAL_COMMUNITY): Payer: Self-pay | Admitting: Anesthesiology

## 2012-11-10 ENCOUNTER — Encounter (HOSPITAL_COMMUNITY)
Admission: RE | Disposition: A | Discharge status: Home / Self Care | Payer: Self-pay | Source: Ambulatory Visit | Attending: Obstetrics and Gynecology

## 2012-11-10 ENCOUNTER — Ambulatory Visit (HOSPITAL_COMMUNITY)
Admission: RE | Admit: 2012-11-10 | Discharge: 2012-11-10 | Disposition: A | Discharge status: Home / Self Care | Payer: 59 | Source: Ambulatory Visit | Attending: Obstetrics and Gynecology | Admitting: Obstetrics and Gynecology

## 2012-11-10 ENCOUNTER — Ambulatory Visit (HOSPITAL_COMMUNITY): Payer: 59 | Admitting: Anesthesiology

## 2012-11-10 DIAGNOSIS — N393 Stress incontinence (female) (male): Secondary | ICD-10-CM | POA: Insufficient documentation

## 2012-11-10 DIAGNOSIS — N949 Unspecified condition associated with female genital organs and menstrual cycle: Secondary | ICD-10-CM | POA: Insufficient documentation

## 2012-11-10 DIAGNOSIS — IMO0002 Reserved for concepts with insufficient information to code with codable children: Secondary | ICD-10-CM | POA: Insufficient documentation

## 2012-11-10 DIAGNOSIS — Z90710 Acquired absence of both cervix and uterus: Secondary | ICD-10-CM | POA: Insufficient documentation

## 2012-11-10 DIAGNOSIS — R300 Dysuria: Secondary | ICD-10-CM | POA: Insufficient documentation

## 2012-11-10 HISTORY — PX: CYSTOSCOPY: SHX5120

## 2012-11-10 HISTORY — DX: Other seasonal allergic rhinitis: J30.2

## 2012-11-10 HISTORY — PX: TRANSVAGINAL TAPE (TVT) REMOVAL: SHX6154

## 2012-11-10 LAB — CBC
MCH: 27.5 pg (ref 26.0–34.0)
MCHC: 34.2 g/dL (ref 30.0–36.0)
MCV: 80.4 fL (ref 78.0–100.0)
Platelets: 182 10*3/uL (ref 150–400)
RBC: 4.91 MIL/uL (ref 3.87–5.11)
RDW: 15.2 % (ref 11.5–15.5)

## 2012-11-10 SURGERY — TRANSVAGINAL TAPE (TVT) REMOVAL
Anesthesia: General | Site: Vagina | Wound class: Clean Contaminated

## 2012-11-10 MED ORDER — CEFAZOLIN SODIUM-DEXTROSE 2-3 GM-% IV SOLR: 2.0000 g | INTRAVENOUS | Status: DC

## 2012-11-10 MED ORDER — MEPERIDINE HCL 25 MG/ML IJ SOLN: 6.2500 mg | INTRAMUSCULAR | Status: DC | PRN

## 2012-11-10 MED ORDER — ONDANSETRON HCL 4 MG/2ML IJ SOLN
INTRAMUSCULAR | Status: AC
  Filled 2012-11-10: qty 2

## 2012-11-10 MED ORDER — VASOPRESSIN 20 UNIT/ML IJ SOLN
INTRAMUSCULAR | Status: AC
  Filled 2012-11-10: qty 1

## 2012-11-10 MED ORDER — LIDOCAINE HCL (CARDIAC) 20 MG/ML IV SOLN
INTRAVENOUS | Status: DC | PRN
  Administered 2012-11-10: 60 mg via INTRAVENOUS

## 2012-11-10 MED ORDER — CEFAZOLIN SODIUM-DEXTROSE 2-3 GM-% IV SOLR
2.0000 g | INTRAVENOUS | Status: AC
  Administered 2012-11-10: 2 g via INTRAVENOUS

## 2012-11-10 MED ORDER — ONDANSETRON HCL 4 MG/2ML IJ SOLN
INTRAMUSCULAR | Status: DC | PRN
  Administered 2012-11-10: 4 mg via INTRAVENOUS

## 2012-11-10 MED ORDER — LACTATED RINGERS IV SOLN
INTRAVENOUS | Status: DC
  Administered 2012-11-10 (×3): via INTRAVENOUS

## 2012-11-10 MED ORDER — MIDAZOLAM HCL 5 MG/5ML IJ SOLN
INTRAMUSCULAR | Status: DC | PRN
  Administered 2012-11-10: 2 mg via INTRAVENOUS

## 2012-11-10 MED ORDER — OXYCODONE-ACETAMINOPHEN 5-325 MG PO TABS: 1.0000 | ORAL_TABLET | ORAL | Status: DC | PRN

## 2012-11-10 MED ORDER — PROPOFOL 10 MG/ML IV BOLUS
INTRAVENOUS | Status: DC | PRN
  Administered 2012-11-10: 200 mg via INTRAVENOUS

## 2012-11-10 MED ORDER — LIDOCAINE HCL (CARDIAC) 20 MG/ML IV SOLN
INTRAVENOUS | Status: AC
  Filled 2012-11-10: qty 5

## 2012-11-10 MED ORDER — METOCLOPRAMIDE HCL 5 MG/ML IJ SOLN: 10.0000 mg | Freq: Once | INTRAMUSCULAR | Status: DC | PRN

## 2012-11-10 MED ORDER — DEXAMETHASONE SODIUM PHOSPHATE 10 MG/ML IJ SOLN
INTRAMUSCULAR | Status: AC
  Filled 2012-11-10: qty 1

## 2012-11-10 MED ORDER — CEFAZOLIN SODIUM-DEXTROSE 2-3 GM-% IV SOLR
INTRAVENOUS | Status: AC
  Filled 2012-11-10: qty 50

## 2012-11-10 MED ORDER — STERILE WATER FOR IRRIGATION IR SOLN
Status: DC | PRN
  Administered 2012-11-10: 1000 mL via INTRAVESICAL

## 2012-11-10 MED ORDER — MIDAZOLAM HCL 2 MG/2ML IJ SOLN
INTRAMUSCULAR | Status: AC
  Filled 2012-11-10: qty 2

## 2012-11-10 MED ORDER — VASOPRESSIN 20 UNIT/ML IJ SOLN
INTRAVENOUS | Status: DC | PRN
  Administered 2012-11-10 (×3): via INTRAMUSCULAR

## 2012-11-10 MED ORDER — KETOROLAC TROMETHAMINE 30 MG/ML IJ SOLN
INTRAMUSCULAR | Status: DC | PRN
  Administered 2012-11-10: 30 mg via INTRAVENOUS

## 2012-11-10 MED ORDER — KETOROLAC TROMETHAMINE 30 MG/ML IJ SOLN
INTRAMUSCULAR | Status: AC
  Filled 2012-11-10: qty 1

## 2012-11-10 MED ORDER — ESTRADIOL 0.1 MG/GM VA CREA
TOPICAL_CREAM | VAGINAL | Status: AC
  Filled 2012-11-10: qty 42.5

## 2012-11-10 MED ORDER — FENTANYL CITRATE 0.05 MG/ML IJ SOLN
INTRAMUSCULAR | Status: AC
  Filled 2012-11-10: qty 2

## 2012-11-10 MED ORDER — IBUPROFEN 600 MG PO TABS: ORAL_TABLET | ORAL | Status: DC

## 2012-11-10 MED ORDER — ESTRADIOL 0.1 MG/GM VA CREA
TOPICAL_CREAM | VAGINAL | Status: DC | PRN
  Administered 2012-11-10: 1 via VAGINAL

## 2012-11-10 MED ORDER — PROPOFOL 10 MG/ML IV EMUL
INTRAVENOUS | Status: AC
  Filled 2012-11-10: qty 20

## 2012-11-10 MED ORDER — FENTANYL CITRATE 0.05 MG/ML IJ SOLN
INTRAMUSCULAR | Status: DC | PRN
  Administered 2012-11-10 (×2): 50 ug via INTRAVENOUS

## 2012-11-10 MED ORDER — FENTANYL CITRATE 0.05 MG/ML IJ SOLN: 25.0000 ug | INTRAMUSCULAR | Status: DC | PRN

## 2012-11-10 SURGICAL SUPPLY — 34 items
BAG URINE DRAINAGE (UROLOGICAL SUPPLIES) ×1 IMPLANT
BLADE SURG 11 STRL SS (BLADE) ×3 IMPLANT
BLADE SURG 15 STRL LF C SS BP (BLADE) ×2 IMPLANT
BLADE SURG 15 STRL SS (BLADE) ×3
CANISTER SUCTION 2500CC (MISCELLANEOUS) ×3 IMPLANT
CATH FOLEY 2WAY SLVR  5CC 18FR (CATHETERS)
CATH FOLEY 2WAY SLVR 5CC 18FR (CATHETERS) ×4 IMPLANT
CATH FOLEY LATEX FREE 14FR (CATHETERS) ×3
CATH FOLEY LF 14FR (CATHETERS) IMPLANT
CATH ROBINSON RED A/P 16FR (CATHETERS) ×1 IMPLANT
CLOTH BEACON ORANGE TIMEOUT ST (SAFETY) ×3 IMPLANT
DECANTER SPIKE VIAL GLASS SM (MISCELLANEOUS) ×2 IMPLANT
DRAPE HYSTEROSCOPY (DRAPE) ×3 IMPLANT
GAUZE PACKING 2X5 YD STERILE (GAUZE/BANDAGES/DRESSINGS) ×2 IMPLANT
GAUZE SPONGE 4X4 16PLY XRAY LF (GAUZE/BANDAGES/DRESSINGS) IMPLANT
GLOVE BIO SURGEON STRL SZ7.5 (GLOVE) ×3 IMPLANT
GLOVE BIOGEL PI IND STRL 7.5 (GLOVE) ×2 IMPLANT
GLOVE BIOGEL PI INDICATOR 7.5 (GLOVE) ×1
GOWN PREVENTION PLUS LG XLONG (DISPOSABLE) ×9 IMPLANT
NDL SPNL 22GX3.5 QUINCKE BK (NEEDLE) IMPLANT
NEEDLE HYPO 22GX1.5 SAFETY (NEEDLE) ×3 IMPLANT
NEEDLE SPNL 22GX3.5 QUINCKE BK (NEEDLE) ×3 IMPLANT
NS IRRIG 1000ML POUR BTL (IV SOLUTION) ×3 IMPLANT
PACK VAGINAL WOMENS (CUSTOM PROCEDURE TRAY) ×3 IMPLANT
SET CYSTO W/LG BORE CLAMP LF (SET/KITS/TRAYS/PACK) ×3 IMPLANT
SUT MNCRL AB 3-0 PS2 27 (SUTURE) IMPLANT
SUT MNCRL AB 4-0 PS2 18 (SUTURE) IMPLANT
SUT VIC AB 0 CT1 27 (SUTURE) ×3
SUT VIC AB 0 CT1 27XBRD ANBCTR (SUTURE) ×2 IMPLANT
SUT VIC AB 2-0 SH 27 (SUTURE) ×9
SUT VIC AB 2-0 SH 27XBRD (SUTURE) ×16 IMPLANT
TOWEL OR 17X24 6PK STRL BLUE (TOWEL DISPOSABLE) ×6 IMPLANT
TRAY FOLEY CATH 14FR (SET/KITS/TRAYS/PACK) ×3 IMPLANT
WATER STERILE IRR 1000ML POUR (IV SOLUTION) ×2 IMPLANT

## 2012-11-10 NOTE — Interval H&P Note (Signed)
History and Physical Interval Note:  11/10/2012 9:36 AM  Sandra Hawkins  has presented today for surgery, with the diagnosis of Abdominal Pain:   The various methods of treatment have been discussed with the patient and family. After consideration of risks, benefits and other options for treatment, the patient has consented to  Procedure(s) with comments: TRANSVAGINAL TAPE (TVT) REMOVAL (N/A) - Removal of Mesh as a surgical intervention .  The patient's history has been reviewed, patient examined, no change in status, stable for surgery.  I have reviewed the patient's chart and labs.  Questions were answered to the patient's satisfaction.  The mesh is being removed per the pt's request for perceived associated pelvic pain.  I have reiterated that I can not guarantee her pain will be relieved by removal of the mesh and she understands.  She also understands sxs of incontinence can return as well.  She would still like to proceed with the planned procedure and her questions were answered.   Purcell Nails

## 2012-11-10 NOTE — Transfer of Care (Signed)
Immediate Anesthesia Transfer of Care Note  Patient: Sandra Hawkins  Procedure(s) Performed: Procedure(s) with comments: TRANSVAGINAL TAPE (TVT) REMOVAL (N/A) - Removal of Mesh CYSTOSCOPY (N/A)  Patient Location: PACU  Anesthesia Type:General  Level of Consciousness: sedated  Airway & Oxygen Therapy: Patient Spontanous Breathing and Patient connected to nasal cannula oxygen  Post-op Assessment: Report given to PACU RN and Post -op Vital signs reviewed and stable  Post vital signs: stable  Complications: No apparent anesthesia complications

## 2012-11-10 NOTE — H&P (View-Only) (Signed)
Sandra Hawkins is a 52 y.o.  female,  P 1-0-2-1 presents for removal of tension free vaginal tape mesh because of associated pelvic pain.  The patient underwent a total laparoscopic hysterectomy with placement of tension free vaginal tape 09/20/2011.  Approximately 4 months after the bladder sling procedure she developed sharp shooting pains from her vaginal area up to her upper abdomen.  This would primarily happen when she voided but would also occur with the passing of flatus and bowel movements.  She goes on to report dyspareunia but denies any bleeding, purulent drainage or incontinence.  The patient believes strongly that her symptoms are related to the vaginal mesh and wants to have it removed.  She understands that relief of her symptoms cannot be guaranteed with removal of the tape and that the incontinence it was placed to treat, may return.  She verbalized understanding of these risks, along with risks of anesthetic complication, injury to adjacent organs, infection and excessive bleeding and would like to proceed with removal of vaginal mesh.  Past Medical History  OB History: G3:  P 1-0-2-1;   SVB in 1983;  History of infertility   GYN History: menarche: 52 YO;  S/P Hysterectomy;   The patient denies history of sexually transmitted disease.  Denies history of abnormal PAP smear  Last PAP smear: 2013  Medical History: Hepatitis B (2008), peptic ulcer disease, pelvic inflammatory disease  Surgical History:  2013: Total Laparoscopic Hysterectomy with Placement of Tension Free Vaginal Tape;  2010: Right Knee Replacement;  2009: Myomectomy; 2009:  D & C                   Denies problems with anesthesia or history of blood transfusions  Family History:  Lung cancer, diabetes, stroke, hypertension, depression and asthma.  Social History: Divorced and employed with the [employer]; Denies alcohol or illicit drug use but is in the process of cigarette smoking  cessation   Medications:  Multivitamins daily  Allergies  Allergen Reactions  . Dairy Aid [Lactase] Other (See Comments)    bloats  . Shellfish Allergy Hives  . Starch     bloats  . Cortisone Rash   Denies sensitivity to peanuts,  latex or adhesives.  ROS: Admits to glasses for reading,  periodic bilateral knee swelling/stiffness,  but  denies headache, vision changes, nasal congestion, dysphagia, tinnitus, dizziness, hoarseness, cough,  chest pain, shortness of breath, nausea, vomiting, diarrhea,constipation,  urinary frequency, urgency  dysuria, hematuria, vaginitis symptoms, easy bruising,  myalgias,  skin rashes, unexplained weight loss and except as is mentioned in the history of present illness, patient's review of systems is otherwise negative.   Physical Exam  Bp :104/70   P:72    R:12     Temperature: 98.2 degrees F oraly    Weight: 203 lbs    Height: 5' 7.5"    BMI: 31.3  Neck: supple without masses or thyromegaly Lungs: clear to auscultation Heart: regular rate and rhythm Abdomen: soft, non-tender and no organomegaly Pelvic:EGBUS- wnl; vagina-normal with tenderness at upper introitus; uterus/cervix-surgically absent adnexae-no tenderness or masses Extremities:  no clubbing, cyanosis or edema  Assesment:  Dysuria                       Pelvic Pain                      S/P Placement of Tension Free Vaginal Tape  Disposition:  A discussion was held with the patient regarding her procedure,along with the risks that include:isks of anesthetic complication, injury to adjacent organs, infection and excessive bleeding.  She further understands that relief of her symptoms cannot be guaranteed with removal of the tape and that with removal her incontinence may return.  She verbalized understanding of these risks and has consented to undergo Removal of Vaginal Mesh, on November 10, 2012 at 9:30 a.m.-[facility].  CSN# [id number]   Wilver Tignor J. Lowell Guitar, PA-C   for Dr. Woodroe Mode. Su Hilt

## 2012-11-10 NOTE — Anesthesia Postprocedure Evaluation (Signed)
  Anesthesia Post-op Note  Patient: Sandra Hawkins  Procedure(s) Performed: Procedure(s) with comments: TRANSVAGINAL TAPE (TVT) REMOVAL (N/A) - Removal of Mesh CYSTOSCOPY (N/A)  Patient Location: PACU  Anesthesia Type:General  Level of Consciousness: awake, alert  and oriented  Airway and Oxygen Therapy: Patient Spontanous Breathing  Post-op Pain: mild  Post-op Assessment: Post-op Vital signs reviewed, Patient's Cardiovascular Status Stable, Respiratory Function Stable, Patent Airway, No signs of Nausea or vomiting and Pain level controlled  Post-op Vital Signs: Reviewed and stable  Complications: No apparent anesthesia complications

## 2012-11-10 NOTE — Anesthesia Preprocedure Evaluation (Signed)
Anesthesia Evaluation  Patient identified by MRN, date of birth, ID band Patient awake    Reviewed: Allergy & Precautions, H&P , NPO status , Patient's Chart, lab work & pertinent test results  Airway       Dental   Pulmonary neg pulmonary ROS, former smoker,          Cardiovascular     Neuro/Psych negative neurological ROS  negative psych ROS   GI/Hepatic (+) Hepatitis -, B  Endo/Other  negative endocrine ROS  Renal/GU negative Renal ROS   Abdominal pain    Musculoskeletal  (+) Arthritis -, Osteoarthritis,    Abdominal   Peds  Hematology negative hematology ROS (+)   Anesthesia Other Findings   Reproductive/Obstetrics negative OB ROS                           Anesthesia Physical Anesthesia Plan  ASA: III  Anesthesia Plan: General   Post-op Pain Management:    Induction: Intravenous  Airway Management Planned: LMA  Additional Equipment:   Intra-op Plan:   Post-operative Plan: Extubation in OR  Informed Consent: I have reviewed the patients History and Physical, chart, labs and discussed the procedure including the risks, benefits and alternatives for the proposed anesthesia with the patient or authorized representative who has indicated his/her understanding and acceptance.   Dental advisory given  Plan Discussed with: CRNA, Anesthesiologist and Surgeon  Anesthesia Plan Comments:         Anesthesia Quick Evaluation

## 2012-11-10 NOTE — Op Note (Signed)
Preop Diagnosis: Pelvic Pain:    Postop Diagnosis: Pelvic Pain:    Procedure: TRANSVAGINAL TAPE (TVT) MESH REMOVAL   Anesthesia: General    Attending: Purcell Nails, MD   Assistant: Henreitta Leber, PA-C  Findings: No erosion of the mesh visualized grossly or with cystoscopy.  Several pictures were taken for documentation.  Bilateral ureters were noted to efflux without difficulty.  Pathology: Mesh sent  Fluids: 1000 cc  UOP: 250cc  EBL: 100 cc  Complications: None  Procedure: The patient was taken to the OR 4 after risks, benefits and alternatives were discussed including but not limited to unresolved symptoms.  The patient verbalized understanding and consent signed and witnessed.  Time out was performed. Cytoscopy was performed and no inadvertent bladder injuries were noted and ureters were noted to efflux without difficulty.  20 units of Pitressin in 50 cc NS was injected in the anterior vaginal wall up to the level of the lower edge of symphysis pubis while catheter was in place.  The anterior vaginal wall was incised and the underlying tissue dissected away and mesh identified.  The mesh was dissected out bilaterally as far as possible, which was to the level just above the lower edge of the symphysis pubis, and excised.  All palpable fibrotic tissue was excised.  The tissue felt normal and pliable.  The incision was repaired with interrupted stitches of 2-0 vicryl.  Cytoscopy was performed and no inadvertent bladder injuries were noted and ureters were noted to efflux without difficulty.  Foley catheter was left to gravity and vagina was packed with estrogen soaked packing.  Sponge, lap and needle count was correct.  The patient tolerated the procedure well and was taken to the recovery room in good condition.

## 2012-11-13 ENCOUNTER — Encounter (HOSPITAL_COMMUNITY): Payer: Self-pay | Admitting: Obstetrics and Gynecology

## 2013-02-13 ENCOUNTER — Encounter: Payer: Self-pay | Admitting: Family Medicine

## 2013-02-13 ENCOUNTER — Ambulatory Visit (INDEPENDENT_AMBULATORY_CARE_PROVIDER_SITE_OTHER): Payer: Federal, State, Local not specified - PPO | Admitting: Family Medicine

## 2013-02-13 VITALS — BP 118/70 | HR 83 | Temp 97.8°F | Wt 204.0 lb

## 2013-02-13 DIAGNOSIS — R1011 Right upper quadrant pain: Secondary | ICD-10-CM

## 2013-02-13 NOTE — Progress Notes (Signed)
Pre visit review using our clinic review tool, if applicable. No additional management support is needed unless otherwise documented below in the visit note. 

## 2013-02-13 NOTE — Patient Instructions (Signed)
nexium 40 mg one daily Bland, low fat diet. Follow up promptly for any fever or increased pain. We will call you regarding ultrasound

## 2013-02-13 NOTE — Progress Notes (Signed)
Subjective:    Patient ID: Sandra Hawkins, female    DOB: May 29, 1960, 53 y.o.   MRN: [id number]  HPI Acute visit for abdominal pain. Onset yesterday. She describes sharp pain epigastric and right upper quadrant. Worse after eating premature any type of food. She has not had any vomiting of some mild nausea. Denies any history of peptic ulcer disease or pancreatitis. Denies any nonsteroidal use. No alcohol use. She does smoke. Pain is described as sharp and moderate intensity. No recent stool changes. No melena. She denies any recent fever or chills. No alleviating factors. She has not taken any antacids.  She has occasional pain radiating substernally but mostly toward the right upper quadrant and midepigastric area. No exertional chest pains. No dyspnea. No cough.  Past Medical History  Diagnosis Date  . Fibroid 04/03/2007  . Irregular menses   . H/O varicella   . History of measles, mumps, or rubella   . Monilial vaginitis 2006  . H/O menorrhagia 2007  . Urge incontinence 2008  . Hepatitis B 11/14/2006  . Pain pelvic   . H/O dysmenorrhea 2009  . History of nausea 2009    With menses   . SVD (spontaneous vaginal delivery)     x 1  . Allergy   . Seasonal allergies   . Arthritis     Rheumatoid - knees   Past Surgical History  Procedure Laterality Date  . Myomectomy abdominal approach  04/03/2007  . Dilation and curettage of uterus    . Knee surgery    . Laparoscopic hysterectomy  09/20/2011    Procedure: HYSTERECTOMY TOTAL LAPAROSCOPIC;  Surgeon: Delice Lesch, MD;  Location: [facility];  Service: Gynecology;  Laterality: N/A;  . Bladder suspension  09/20/2011    Procedure: TRANSVAGINAL TAPE (TVT) PROCEDURE;  Surgeon: Delice Lesch, MD;  Location: [facility];  Service: Gynecology;  Laterality: N/A;  . Cystoscopy  09/20/2011    Procedure: CYSTOSCOPY;  Surgeon: Delice Lesch, MD;  Location: [facility];  Service: Gynecology;;  . Joint replacement      right knee  . Wisdom  tooth extraction    . Transvaginal tape (tvt) removal N/A 11/10/2012    Procedure: TRANSVAGINAL TAPE (TVT) REMOVAL;  Surgeon: Delice Lesch, MD;  Location: [facility];  Service: Gynecology;  Laterality: N/A;  Removal of Mesh  . Cystoscopy N/A 11/10/2012    Procedure: CYSTOSCOPY;  Surgeon: Delice Lesch, MD;  Location: [facility];  Service: Gynecology;  Laterality: N/A;    reports that she quit smoking about 11 months ago. Her smoking use included Cigarettes. She has a 3 pack-year smoking history. She has never used smokeless tobacco. She reports that she drinks alcohol. She reports that she does not use illicit drugs. family history includes Asthma in her maternal grandmother; Cancer in her father; Diabetes in her maternal grandmother and sister. Allergies  Allergen Reactions  . Dairy Aid [Lactase] Other (See Comments)    bloats  . Shellfish Allergy Hives  . Starch     bloats  . Cortisone Rash      Review of Systems  Constitutional: Negative for fever, chills and fatigue.  Respiratory: Negative for cough and shortness of breath.   Cardiovascular: Negative for chest pain.  Gastrointestinal: Positive for nausea and abdominal pain. Negative for vomiting, diarrhea, blood in stool and abdominal distention.  Genitourinary: Negative for dysuria.       Objective:   Physical Exam  Constitutional: She appears well-developed and well-nourished.  HENT:  Mouth/Throat: Oropharynx is clear and moist.  Cardiovascular: Normal rate.   Pulmonary/Chest: Effort normal and breath sounds normal. No respiratory distress. She has no wheezes. She has no rales.  Abdominal: Soft. She exhibits no distension and no mass. There is tenderness. There is no rebound and no guarding.  Patient has some mild to moderate tenderness mid epigastric and right upper quadrant region.  Skin: No rash noted.          Assessment & Plan:  Abdominal pain epigastric and right upper quadrant. No acute abdomen on exam at this  time. Obtain labs with basic metabolic panel, hepatic panel, CBC, lipase. Set up abdominal ultrasound to further assess. Bland low-fat diet. Samples of Nexium 40 mg once daily pending further workup.

## 2013-02-14 ENCOUNTER — Emergency Department (HOSPITAL_COMMUNITY)
Admission: EM | Admit: 2013-02-14 | Discharge: 2013-02-14 | Disposition: A | Discharge status: Home / Self Care | Payer: 59 | Attending: Emergency Medicine | Admitting: Emergency Medicine

## 2013-02-14 ENCOUNTER — Emergency Department (HOSPITAL_COMMUNITY): Payer: 59

## 2013-02-14 ENCOUNTER — Encounter (HOSPITAL_COMMUNITY): Payer: Self-pay | Admitting: Emergency Medicine

## 2013-02-14 ENCOUNTER — Telehealth: Payer: Self-pay | Admitting: Family Medicine

## 2013-02-14 DIAGNOSIS — R109 Unspecified abdominal pain: Secondary | ICD-10-CM

## 2013-02-14 DIAGNOSIS — Z90710 Acquired absence of both cervix and uterus: Secondary | ICD-10-CM | POA: Insufficient documentation

## 2013-02-14 DIAGNOSIS — R110 Nausea: Secondary | ICD-10-CM | POA: Insufficient documentation

## 2013-02-14 DIAGNOSIS — R1013 Epigastric pain: Secondary | ICD-10-CM | POA: Insufficient documentation

## 2013-02-14 DIAGNOSIS — Z8739 Personal history of other diseases of the musculoskeletal system and connective tissue: Secondary | ICD-10-CM | POA: Insufficient documentation

## 2013-02-14 DIAGNOSIS — Z9889 Other specified postprocedural states: Secondary | ICD-10-CM | POA: Insufficient documentation

## 2013-02-14 DIAGNOSIS — Z8619 Personal history of other infectious and parasitic diseases: Secondary | ICD-10-CM | POA: Insufficient documentation

## 2013-02-14 DIAGNOSIS — R079 Chest pain, unspecified: Secondary | ICD-10-CM | POA: Insufficient documentation

## 2013-02-14 DIAGNOSIS — Z8742 Personal history of other diseases of the female genital tract: Secondary | ICD-10-CM | POA: Insufficient documentation

## 2013-02-14 DIAGNOSIS — R1011 Right upper quadrant pain: Secondary | ICD-10-CM | POA: Insufficient documentation

## 2013-02-14 DIAGNOSIS — Z79899 Other long term (current) drug therapy: Secondary | ICD-10-CM | POA: Insufficient documentation

## 2013-02-14 DIAGNOSIS — F17200 Nicotine dependence, unspecified, uncomplicated: Secondary | ICD-10-CM | POA: Insufficient documentation

## 2013-02-14 DIAGNOSIS — R1012 Left upper quadrant pain: Secondary | ICD-10-CM | POA: Insufficient documentation

## 2013-02-14 LAB — BASIC METABOLIC PANEL
BUN: 7 mg/dL (ref 6–23)
CHLORIDE: 103 meq/L (ref 96–112)
CO2: 28 mEq/L (ref 19–32)
Calcium: 9.4 mg/dL (ref 8.4–10.5)
Creatinine, Ser: 0.93 mg/dL (ref 0.50–1.10)
GFR calc non Af Amer: 70 mL/min — ABNORMAL LOW (ref >90)
GFR, EST AFRICAN AMERICAN: 82 mL/min — AB (ref >90)
GLUCOSE: 95 mg/dL (ref 70–99)
POTASSIUM: 4.5 meq/L (ref 3.7–5.3)
SODIUM: 142 meq/L (ref 137–147)

## 2013-02-14 LAB — HEPATIC FUNCTION PANEL
ALK PHOS: 112 U/L (ref 39–117)
ALT: 21 U/L (ref 0–35)
AST: 19 U/L (ref 0–37)
Albumin: 3.9 g/dL (ref 3.5–5.2)
TOTAL PROTEIN: 8.6 g/dL — AB (ref 6.0–8.3)
Total Bilirubin: 0.5 mg/dL (ref 0.3–1.2)

## 2013-02-14 LAB — URINALYSIS, ROUTINE W REFLEX MICROSCOPIC
Bilirubin Urine: NEGATIVE
GLUCOSE, UA: NEGATIVE mg/dL
Hgb urine dipstick: NEGATIVE
KETONES UR: NEGATIVE mg/dL
LEUKOCYTES UA: NEGATIVE
Nitrite: NEGATIVE
PH: 7 (ref 5.0–8.0)
Protein, ur: NEGATIVE mg/dL
Specific Gravity, Urine: 1.018 (ref 1.005–1.030)
Urobilinogen, UA: 0.2 mg/dL (ref 0.0–1.0)

## 2013-02-14 LAB — CBC
HCT: 43.4 % (ref 36.0–46.0)
HEMOGLOBIN: 14.5 g/dL (ref 12.0–15.0)
MCH: 27.5 pg (ref 26.0–34.0)
MCHC: 33.4 g/dL (ref 30.0–36.0)
MCV: 82.4 fL (ref 78.0–100.0)
Platelets: 186 10*3/uL (ref 150–400)
RBC: 5.27 MIL/uL — AB (ref 3.87–5.11)
RDW: 15.1 % (ref 11.5–15.5)
WBC: 8.3 10*3/uL (ref 4.0–10.5)

## 2013-02-14 LAB — LIPASE, BLOOD: Lipase: 20 U/L (ref 11–59)

## 2013-02-14 LAB — POCT I-STAT TROPONIN I: Troponin i, poc: 0 ng/mL (ref 0.00–0.08)

## 2013-02-14 MED ORDER — MORPHINE SULFATE 4 MG/ML IJ SOLN
6.0000 mg | Freq: Once | INTRAMUSCULAR | Status: AC
  Administered 2013-02-14: 6 mg via INTRAVENOUS
  Filled 2013-02-14: qty 2

## 2013-02-14 MED ORDER — KETOROLAC TROMETHAMINE 30 MG/ML IJ SOLN
15.0000 mg | Freq: Once | INTRAMUSCULAR | Status: AC
  Administered 2013-02-14: 15 mg via INTRAVENOUS
  Filled 2013-02-14: qty 1

## 2013-02-14 MED ORDER — ONDANSETRON HCL 4 MG/2ML IJ SOLN
4.0000 mg | Freq: Once | INTRAMUSCULAR | Status: AC
  Administered 2013-02-14: 4 mg via INTRAVENOUS
  Filled 2013-02-14: qty 2

## 2013-02-14 NOTE — Discharge Instructions (Signed)

## 2013-02-14 NOTE — ED Notes (Signed)
Pt c/o abd pain that started yesterday, worse after she would eat, then she started to get some CP and broke into a sweat. The pain didn't go away last night so this morning she called her PCP and they told her to come into ED. Reports that pain starts right under left breast and wraps into back, sts the pain is intermittent but the back pain is constant. Nad, skin warm and dry, resp e/u.

## 2013-02-14 NOTE — Telephone Encounter (Signed)
Caller: Sandra Hawkins/Patient; Phone: [phone]; Reason for Call: Contacted patient related to complaint of chest pain.  She reports that she is currently checking in at the ED; did not specify which facility.  Note to office as information.

## 2013-02-14 NOTE — ED Notes (Signed)
Pt laying in bed talking on phone. Dr. Stevie Kern at bedside explaining all results to pt.

## 2013-02-14 NOTE — Telephone Encounter (Signed)
Noted patient is at ED

## 2013-02-14 NOTE — Telephone Encounter (Signed)
Pt went to [place] today. Pt had abd ultrasound also

## 2013-02-14 NOTE — Telephone Encounter (Signed)
Attempted to return call to patient regarding chest pain.  Left 2 voice mails to call office.

## 2013-02-14 NOTE — ED Provider Notes (Signed)
CSN: [id number]     Arrival date & time 02/14/13  1030 History   First MD Initiated Contact with Patient 02/14/13 1239     Chief Complaint  Patient presents with  . Chest Pain  . Abdominal Pain   (Consider location/radiation/quality/duration/timing/severity/associated sxs/prior Treatment) HPI Over the last 3 days has had several episodes of upper abdominal pain which occur every time she eats the pain radiates to her chest and back with nausea but no vomiting no bloody stools no dysuria treatment prior to arrival consistent Nexium from her family doctor who ordered ultrasound and blood tests which has not been performed yet and was sent to the ED since her pain returns today; each episode has lasted one to 2 hours but the pain was more intense and lasting over a couple hours today so she came to the ED she is no cough no fever no shortness breath no exertional chest pain; pains have been mild to moderate   Past Medical History  Diagnosis Date  . Fibroid 04/03/2007  . Irregular menses   . H/O varicella   . History of measles, mumps, or rubella   . Monilial vaginitis 2006  . H/O menorrhagia 2007  . Urge incontinence 2008  . Hepatitis B 11/14/2006  . Pain pelvic   . H/O dysmenorrhea 2009  . History of nausea 2009    With menses   . SVD (spontaneous vaginal delivery)     x 1  . Allergy   . Seasonal allergies   . Arthritis     Rheumatoid - knees   Past Surgical History  Procedure Laterality Date  . Myomectomy abdominal approach  04/03/2007  . Dilation and curettage of uterus    . Knee surgery    . Laparoscopic hysterectomy  09/20/2011    Procedure: HYSTERECTOMY TOTAL LAPAROSCOPIC;  Surgeon: Delice Lesch, MD;  Location: [facility];  Service: Gynecology;  Laterality: N/A;  . Bladder suspension  09/20/2011    Procedure: TRANSVAGINAL TAPE (TVT) PROCEDURE;  Surgeon: Delice Lesch, MD;  Location: [facility];  Service: Gynecology;  Laterality: N/A;  . Cystoscopy  09/20/2011    Procedure:  CYSTOSCOPY;  Surgeon: Delice Lesch, MD;  Location: [facility];  Service: Gynecology;;  . Joint replacement      right knee  . Wisdom tooth extraction    . Transvaginal tape (tvt) removal N/A 11/10/2012    Procedure: TRANSVAGINAL TAPE (TVT) REMOVAL;  Surgeon: Delice Lesch, MD;  Location: [facility];  Service: Gynecology;  Laterality: N/A;  Removal of Mesh  . Cystoscopy N/A 11/10/2012    Procedure: CYSTOSCOPY;  Surgeon: Delice Lesch, MD;  Location: [facility];  Service: Gynecology;  Laterality: N/A;   Family History  Problem Relation Age of Onset  . Cancer Father     Lung  . Diabetes Sister   . Asthma Maternal Grandmother   . Diabetes Maternal Grandmother    History  Substance Use Topics  . Smoking status: Current Every Day Smoker -- 1.00 packs/day for 3 years    Types: Cigarettes    Last Attempt to Quit: 03/14/2012  . Smokeless tobacco: Never Used  . Alcohol Use: Yes     Comment: socially   OB History   Grav Para Term Preterm Abortions TAB SAB Ect Mult Living   3 1   2  2   1      Review of Systems 10 Systems reviewed and are negative for acute change except as noted in the HPI.  Allergies  Dairy aid; Shellfish allergy; Starch; and Cortisone  Home Medications   Current Outpatient Rx  Name  Route  Sig  Dispense  Refill  . esomeprazole (NEXIUM) 40 MG capsule   Oral   Take 40 mg by mouth daily at 12 noon.          BP 108/65  Pulse 55  Temp(Src) 97.7 F (36.5 C) (Oral)  Resp 14  SpO2 100%  LMP 08/07/2011 Physical Exam  Nursing note and vitals reviewed. Constitutional:  Awake, alert, nontoxic appearance.  HENT:  Head: Atraumatic.  Eyes: Right eye exhibits no discharge. Left eye exhibits no discharge.  Neck: Neck supple.  Cardiovascular: Normal rate and regular rhythm.   No murmur heard. Pulmonary/Chest: Effort normal and breath sounds normal. No respiratory distress. She has no wheezes. She has no rales. She exhibits no tenderness.  Abdominal: Soft. Bowel sounds  are normal. She exhibits no distension and no mass. There is tenderness. There is no rebound and no guarding.  Mildly tender upper abdomen mostly epigastric but also right upper quadrant and left upper quadrant  Musculoskeletal: She exhibits no edema and no tenderness.  Baseline ROM, no obvious new focal weakness.  Neurological: She is alert.  Mental status and motor strength appears baseline for patient and situation.  Skin: No rash noted.  Psychiatric: She has a normal mood and affect.    ED Course  Procedures (including critical care time) Pt feels improved after observation and/or treatment in ED.Patient informed of clinical course, understand medical decision-making process, and agree with plan. Labs Review Labs Reviewed  BASIC METABOLIC PANEL - Abnormal; Notable for the following:    GFR calc non Af Amer 70 (*)    GFR calc Af Amer 82 (*)    All other components within normal limits  CBC - Abnormal; Notable for the following:    RBC 5.27 (*)    All other components within normal limits  URINALYSIS, ROUTINE W REFLEX MICROSCOPIC - Abnormal; Notable for the following:    APPearance HAZY (*)    All other components within normal limits  HEPATIC FUNCTION PANEL - Abnormal; Notable for the following:    Total Protein 8.6 (*)    All other components within normal limits  LIPASE, BLOOD  POCT I-STAT TROPONIN I   Imaging Review Dg Chest 2 View  02/14/2013   CLINICAL DATA:  Chest pain.  EXAM: CHEST  2 VIEW  COMPARISON:  PA and lateral chest 06/05/2008.  FINDINGS: Heart size and mediastinal contours are within normal limits. Both lungs are clear. Visualized skeletal structures are unremarkable.  IMPRESSION: Negative exam.   Electronically Signed   By: Inge Rise M.D.   On: 02/14/2013 11:13   US Abdomen Complete  02/14/2013   CLINICAL DATA:  Upper abdominal pain. Question gallstones. History of hepatitis B.  EXAM: ULTRASOUND ABDOMEN COMPLETE  COMPARISON:  None.  FINDINGS: Gallbladder:   No gallstones or wall thickening visualized. No sonographic Murphy sign noted.  Common bile duct:  Diameter: 0.4 cm.  Liver:  No focal lesion identified. Within normal limits in parenchymal echogenicity.  IVC:  No abnormality visualized.  Pancreas:  Visualized portion unremarkable.  Spleen:  Size and appearance within normal limits.  Right Kidney:  Length: 11.9 cm. Echogenicity within normal limits. No mass or hydronephrosis visualized.  Left Kidney:  Length: 10.6 cm. Echogenicity within normal limits. No mass or hydronephrosis visualized.  Abdominal aorta:  No aneurysm visualized.  Other findings:  None.  IMPRESSION: Negative  for gallstones.  Negative exam.   Electronically Signed   By: Inge Rise M.D.   On: 02/14/2013 14:52    EKG Interpretation    Date/Time:  Wednesday February 14 2013 10:36:08 EST Ventricular Rate:  100 PR Interval:  122 QRS Duration: 72 QT Interval:  340 QTC Calculation: 438 R Axis:   79 Text Interpretation:  Normal sinus rhythm Right atrial enlargement No significant change since last tracing Confirmed by [facility]  MD, Sheryn Aldaz ([id number]) on 02/14/2013 12:42:59 PM            MDM   1. Abdominal pain    I doubt any other EMC precluding discharge at this time including, but not necessarily limited to the following:SBI, ACS.    Babette Relic, MD 02/14/13 [phone]

## 2013-02-14 NOTE — ED Notes (Signed)
Pt is here with abdominal pain to LUQ and then became woozy and pain radiated to chest.  Pt reported some diaphoresis.  Pt reports pain in chest is just a discomfort at this time

## 2013-02-14 NOTE — ED Notes (Signed)
Called main lab, spoke with Freda Munro she reports they can add on the bld to what they already have.

## 2013-02-14 NOTE — Telephone Encounter (Signed)
Transferred pt back to triage, a live person!!

## 2013-02-19 ENCOUNTER — Other Ambulatory Visit: Payer: 59

## 2013-06-13 ENCOUNTER — Encounter (HOSPITAL_COMMUNITY): Payer: Self-pay | Admitting: Obstetrics and Gynecology

## 2013-07-10 ENCOUNTER — Telehealth: Payer: Self-pay | Admitting: Family Medicine

## 2013-07-10 NOTE — Telephone Encounter (Signed)
Pt states she has not received a phone call regarding her referral to see the GI  Doctror, states dr. Elease Hashimoto requested the referral months ago. Pt wants to know the status. Pt is requesting call back today with status.

## 2013-08-21 ENCOUNTER — Telehealth: Payer: Self-pay | Admitting: Family Medicine

## 2013-08-21 NOTE — Telephone Encounter (Signed)
Noted  

## 2013-08-21 NOTE — Telephone Encounter (Signed)
Patient Information:  Caller Name: Tamikka  Phone: [phone]  Patient: Salsbury,   Gender: Female  DOB: 1960-10-09  Age: 53 Years  PCP: Carolann Littler (Family Practice)  Pregnant: No  Office Follow Up:  Does the office need to follow up with this patient?: No  Instructions For The Office: N/A   Symptoms  Reason For Call & Symptoms: Pain in  L side of chest and back on and off on 7/9 and on 7/10 - brief sharp pains for only a few seconds that went away.. She bit pinky fingernail down the quick and nail turned black after she picked at nail with tweezers. She has nervous habit. Last week L hand swollen on palm and today it is swollen up to elbow. No injuries. Pinky cuticle is red and possibly infected. No drainage. Afebrile.  She is also has sharp pain before she has to have a bm for past year. She normally does not have diarrhea but loose stools for past week after eating spicy chicken. Requesting referral for Colonoscopy.   Reviewed Health History In EMR: Yes  Reviewed Medications In EMR: Yes  Reviewed Allergies In EMR: Yes  Reviewed Surgeries / Procedures: Yes  Date of Onset of Symptoms: 08/14/2013 OB / GYN:  LMP: Unknown  Guideline(s) Used:  Wound Infection  Disposition Per Guideline:   Go to Office Now  Reason For Disposition Reached:   Finger wound and entire finger swollen  Advice Given:  Warm Soaks or Local Heat:  If the wound is open, soak it in warm water or put a warm wet cloth on the wound for 20 minutes 3 times per day. Use a warm saltwater solution containing 2 teaspoons of table salt per quart of water. If the wound is closed, apply a heating pad or warm, moist washcloth to the reddened area for 20 minutes 3 times per day.  Antibiotic Ointment:  Apply an antibiotic ointment 3 times a day. If the area could become dirty, cover with a Band-Aid or a clean gauze dressing.  Warm Soaks or Local Heat:  If the wound is open, soak it in warm water or put a warm wet  cloth on the wound for 20 minutes 3 times per day. Use a warm saltwater solution containing 2 teaspoons of table salt per quart of water. If the wound is closed, apply a heating pad or warm, moist washcloth to the reddened area for 20 minutes 3 times per day.  Contagiousness:  For true wound infections, you can return to work or school after any fever is gone and you have received antibiotics for 24 hours.  Call Back If:   Wound becomes more tender  Redness starts to spread  Pus, drainage, or fever occurs  You become worse  Expected Course:  Pain and swelling normally peak on day 2. Any redness should go away by day 3 or 4. Complete healing should occur by day 10.  Patient Will Follow Care Advice: She is refusing to be seen at [facility] office or UC. Wants appointment on 08/22/13 on her lunch break.   Appointment Scheduled:  08/22/2013 13:45:00 Appointment Scheduled Provider:  Carolann Littler [facility])

## 2013-08-22 ENCOUNTER — Ambulatory Visit (INDEPENDENT_AMBULATORY_CARE_PROVIDER_SITE_OTHER): Payer: Federal, State, Local not specified - PPO | Admitting: Family Medicine

## 2013-08-22 ENCOUNTER — Encounter: Payer: Self-pay | Admitting: Family Medicine

## 2013-08-22 VITALS — BP 118/78 | HR 71 | Wt 211.0 lb

## 2013-08-22 DIAGNOSIS — R635 Abnormal weight gain: Secondary | ICD-10-CM

## 2013-08-22 DIAGNOSIS — S61309A Unspecified open wound of unspecified finger with damage to nail, initial encounter: Secondary | ICD-10-CM

## 2013-08-22 DIAGNOSIS — Z1211 Encounter for screening for malignant neoplasm of colon: Secondary | ICD-10-CM

## 2013-08-22 DIAGNOSIS — S61209A Unspecified open wound of unspecified finger without damage to nail, initial encounter: Secondary | ICD-10-CM

## 2013-08-22 NOTE — Progress Notes (Signed)
Pre visit review using our clinic review tool, if applicable. No additional management support is needed unless otherwise documented below in the visit note. 

## 2013-08-22 NOTE — Progress Notes (Signed)
Subjective:    Patient ID: Sandra Hawkins, female    DOB: [birth year], 53 y.o.   MRN: [id number]  HPI Patient seen for the following  She noticed some mild swelling of her right hand and wrist. She was concerned about possible infection. She recently had been picking at her fifth digit fingernail excessively to the point that she eventually lost her nail. She denies any trauma. No erythema. No warmth. No fevers or chills. She is not having any generalized arthralgias.  Patient also requesting referral for colonoscopy screening. No recent change in bowel habits.  She's had some recent weight gain. Not regularly exercising. She eats out of stress and anxiety. She had TSH last year which was normal.  Past Medical History  Diagnosis Date  . Fibroid 04/03/2007  . Irregular menses   . H/O varicella   . History of measles, mumps, or rubella   . Monilial vaginitis 2006  . H/O menorrhagia 2007  . Urge incontinence 2008  . Hepatitis B 11/14/2006  . Pain pelvic   . H/O dysmenorrhea 2009  . History of nausea 2009    With menses   . SVD (spontaneous vaginal delivery)     x 1  . Allergy   . Seasonal allergies   . Arthritis     Rheumatoid - knees   Past Surgical History  Procedure Laterality Date  . Myomectomy abdominal approach  04/03/2007  . Dilation and curettage of uterus    . Knee surgery    . Laparoscopic hysterectomy  09/20/2011    Procedure: HYSTERECTOMY TOTAL LAPAROSCOPIC;  Surgeon: Delice Lesch, MD;  Location: [facility];  Service: Gynecology;  Laterality: N/A;  . Bladder suspension  09/20/2011    Procedure: TRANSVAGINAL TAPE (TVT) PROCEDURE;  Surgeon: Delice Lesch, MD;  Location: [facility];  Service: Gynecology;  Laterality: N/A;  . Cystoscopy  09/20/2011    Procedure: CYSTOSCOPY;  Surgeon: Delice Lesch, MD;  Location: [facility];  Service: Gynecology;;  . Joint replacement      right knee  . Wisdom tooth extraction    . Transvaginal tape (tvt) removal N/A 11/10/2012   Procedure: TRANSVAGINAL TAPE (TVT) REMOVAL;  Surgeon: Delice Lesch, MD;  Location: [facility];  Service: Gynecology;  Laterality: N/A;  Removal of Mesh  . Cystoscopy N/A 11/10/2012    Procedure: CYSTOSCOPY;  Surgeon: Delice Lesch, MD;  Location: [facility];  Service: Gynecology;  Laterality: N/A;    reports that she has been smoking Cigarettes.  She has a 3 pack-year smoking history. She has never used smokeless tobacco. She reports that she drinks alcohol. She reports that she does not use illicit drugs. family history includes Asthma in her maternal grandmother; Cancer in her father; Diabetes in her maternal grandmother and sister. Allergies  Allergen Reactions  . Dairy Aid [Lactase] Other (See Comments)    bloats  . Shellfish Allergy Hives  . Starch     bloats  . Cortisone Rash      Review of Systems  Constitutional: Negative for fever, chills, appetite change and unexpected weight change.  Respiratory: Negative for shortness of breath.   Cardiovascular: Negative for chest pain.  Gastrointestinal: Negative for nausea, vomiting, abdominal pain, diarrhea and blood in stool.  Hematological: Negative for adenopathy. Does not bruise/bleed easily.       Objective:   Physical Exam  Constitutional: She appears well-developed and well-nourished.  Neck: Neck supple. No thyromegaly present.  Cardiovascular: Normal rate and regular rhythm.  No murmur heard. Pulmonary/Chest: Effort normal and breath sounds normal. No respiratory distress. She has no wheezes. She has no rales.  Musculoskeletal: She exhibits no edema.  Skin:  Left fifth digit reveals absence of the nail. No evidence for trauma otherwise. No erythema. No warmth. Nontender. No skin rashes.          Assessment & Plan:  #1 recent loss left fifth digit fingernail. Patient states that she had a nervous habit of picking at this frequently and eventually the nail came off. No signs of secondary infection. No evidence for  onychomycosis. Avoid future trauma #2 colonoscopy screening. No history of prior. Referral made #3 weight gain. We discussed strategies for weight loss. She had questions regarding medications and we've recommended lifestyle modification first

## 2013-08-23 ENCOUNTER — Telehealth: Payer: Self-pay | Admitting: Family Medicine

## 2013-08-23 ENCOUNTER — Encounter: Payer: Self-pay | Admitting: Gastroenterology

## 2013-08-23 NOTE — Telephone Encounter (Signed)
Relevant patient education assigned to patient using Emmi. ° °

## 2013-09-09 ENCOUNTER — Encounter (HOSPITAL_COMMUNITY): Payer: Self-pay | Admitting: Emergency Medicine

## 2013-09-09 ENCOUNTER — Emergency Department (HOSPITAL_COMMUNITY): Payer: Federal, State, Local not specified - PPO

## 2013-09-09 ENCOUNTER — Emergency Department (HOSPITAL_COMMUNITY)
Admission: EM | Admit: 2013-09-09 | Discharge: 2013-09-09 | Disposition: A | Discharge status: Home / Self Care | Payer: Federal, State, Local not specified - PPO | Attending: Emergency Medicine | Admitting: Emergency Medicine

## 2013-09-09 DIAGNOSIS — Z720 Tobacco use: Secondary | ICD-10-CM

## 2013-09-09 DIAGNOSIS — Z8709 Personal history of other diseases of the respiratory system: Secondary | ICD-10-CM | POA: Insufficient documentation

## 2013-09-09 DIAGNOSIS — Z8742 Personal history of other diseases of the female genital tract: Secondary | ICD-10-CM | POA: Insufficient documentation

## 2013-09-09 DIAGNOSIS — M545 Low back pain, unspecified: Secondary | ICD-10-CM | POA: Insufficient documentation

## 2013-09-09 DIAGNOSIS — F17200 Nicotine dependence, unspecified, uncomplicated: Secondary | ICD-10-CM | POA: Insufficient documentation

## 2013-09-09 DIAGNOSIS — M5380 Other specified dorsopathies, site unspecified: Secondary | ICD-10-CM | POA: Insufficient documentation

## 2013-09-09 DIAGNOSIS — M069 Rheumatoid arthritis, unspecified: Secondary | ICD-10-CM | POA: Insufficient documentation

## 2013-09-09 DIAGNOSIS — G8929 Other chronic pain: Secondary | ICD-10-CM | POA: Insufficient documentation

## 2013-09-09 DIAGNOSIS — Z8619 Personal history of other infectious and parasitic diseases: Secondary | ICD-10-CM | POA: Insufficient documentation

## 2013-09-09 DIAGNOSIS — M62830 Muscle spasm of back: Secondary | ICD-10-CM

## 2013-09-09 LAB — CBC WITH DIFFERENTIAL/PLATELET
BASOS ABS: 0 10*3/uL (ref 0.0–0.1)
BASOS PCT: 0 % (ref 0–1)
Eosinophils Absolute: 0.1 10*3/uL (ref 0.0–0.7)
Eosinophils Relative: 1 % (ref 0–5)
HEMATOCRIT: 40.6 % (ref 36.0–46.0)
Hemoglobin: 13.4 g/dL (ref 12.0–15.0)
Lymphocytes Relative: 39 % (ref 12–46)
Lymphs Abs: 2.7 10*3/uL (ref 0.7–4.0)
MCH: 27 pg (ref 26.0–34.0)
MCHC: 33 g/dL (ref 30.0–36.0)
MCV: 81.9 fL (ref 78.0–100.0)
Monocytes Absolute: 0.5 10*3/uL (ref 0.1–1.0)
Monocytes Relative: 7 % (ref 3–12)
Neutro Abs: 3.7 10*3/uL (ref 1.7–7.7)
Neutrophils Relative %: 53 % (ref 43–77)
Platelets: 172 10*3/uL (ref 150–400)
RBC: 4.96 MIL/uL (ref 3.87–5.11)
RDW: 15 % (ref 11.5–15.5)
WBC: 7 10*3/uL (ref 4.0–10.5)

## 2013-09-09 LAB — BASIC METABOLIC PANEL
ANION GAP: 11 (ref 5–15)
BUN: 13 mg/dL (ref 6–23)
CALCIUM: 9 mg/dL (ref 8.4–10.5)
CO2: 24 mEq/L (ref 19–32)
CREATININE: 0.74 mg/dL (ref 0.50–1.10)
Chloride: 103 mEq/L (ref 96–112)
GFR calc non Af Amer: >90 mL/min (ref >90)
Glucose, Bld: 95 mg/dL (ref 70–99)
Potassium: 4.9 mEq/L (ref 3.7–5.3)
Sodium: 138 mEq/L (ref 137–147)

## 2013-09-09 LAB — URINALYSIS, ROUTINE W REFLEX MICROSCOPIC
BILIRUBIN URINE: NEGATIVE
Glucose, UA: NEGATIVE mg/dL
Hgb urine dipstick: NEGATIVE
Ketones, ur: NEGATIVE mg/dL
Leukocytes, UA: NEGATIVE
Nitrite: NEGATIVE
PROTEIN: NEGATIVE mg/dL
Specific Gravity, Urine: 1.019 (ref 1.005–1.030)
UROBILINOGEN UA: 0.2 mg/dL (ref 0.0–1.0)
pH: 7.5 (ref 5.0–8.0)

## 2013-09-09 MED ORDER — HYDROCODONE-ACETAMINOPHEN 5-325 MG PO TABS: 1.0000 | ORAL_TABLET | Freq: Four times a day (QID) | ORAL | Status: DC | PRN

## 2013-09-09 MED ORDER — NAPROXEN 500 MG PO TABS: 500.0000 mg | ORAL_TABLET | Freq: Two times a day (BID) | ORAL | Status: DC

## 2013-09-09 MED ORDER — ONDANSETRON 4 MG PO TBDP
4.0000 mg | ORAL_TABLET | Freq: Once | ORAL | Status: AC
  Administered 2013-09-09: 4 mg via ORAL
  Filled 2013-09-09: qty 1

## 2013-09-09 MED ORDER — CYCLOBENZAPRINE HCL 10 MG PO TABS: 10.0000 mg | ORAL_TABLET | Freq: Three times a day (TID) | ORAL | Status: DC | PRN

## 2013-09-09 MED ORDER — OXYCODONE-ACETAMINOPHEN 5-325 MG PO TABS
2.0000 | ORAL_TABLET | Freq: Once | ORAL | Status: AC
  Administered 2013-09-09: 2 via ORAL
  Filled 2013-09-09: qty 2

## 2013-09-09 MED ORDER — OXYCODONE HCL 5 MG PO TABS: 5.0000 mg | ORAL_TABLET | ORAL | Status: DC | PRN

## 2013-09-09 NOTE — ED Notes (Signed)
Pt. Stated, I've had back pain and chest pain for a couple of days.   Pain after I pee for awhile. Pt. C/o pain in both flank areas.

## 2013-09-09 NOTE — Discharge Instructions (Signed)
SEEK IMMEDIATE MEDICAL ATTENTION IF: New numbness, tingling, weakness, or problem with the use of your arms or legs.  Severe back pain not relieved with medications.  Change in bowel or bladder control.  Increasing pain in any areas of the body (such as chest or abdominal pain).  Shortness of breath, dizziness or fainting.  Nausea (feeling sick to your stomach), vomiting, fever, or sweats.    Musculoskeletal Pain Musculoskeletal pain is muscle and boney aches and pains. These pains can occur in any part of the body. Your caregiver may treat you without knowing the cause of the pain. They may treat you if blood or urine tests, X-rays, and other tests were normal.  CAUSES There is often not a definite cause or reason for these pains. These pains may be caused by a type of germ (virus). The discomfort may also come from overuse. Overuse includes working out too hard when your body is not fit. Boney aches also come from weather changes. Bone is sensitive to atmospheric pressure changes. HOME CARE INSTRUCTIONS   Ask when your test results will be ready. Make sure you get your test results.  Only take over-the-counter or prescription medicines for pain, discomfort, or fever as directed by your caregiver. If you were given medications for your condition, do not drive, operate machinery or power tools, or sign legal documents for 24 hours. Do not drink alcohol. Do not take sleeping pills or other medications that may interfere with treatment.  Continue all activities unless the activities cause more pain. When the pain lessens, slowly resume normal activities. Gradually increase the intensity and duration of the activities or exercise.  During periods of severe pain, bed rest may be helpful. Lay or sit in any position that is comfortable.  Putting ice on the injured area.  Put ice in a bag.  Place a towel between your skin and the bag.  Leave the ice on for 15 to 20 minutes, 3 to 4 times a  day.  Follow up with your caregiver for continued problems and no reason can be found for the pain. If the pain becomes worse or does not go away, it may be necessary to repeat tests or do additional testing. Your caregiver may need to look further for a possible cause. SEEK IMMEDIATE MEDICAL CARE IF:  You have pain that is getting worse and is not relieved by medications.  You develop chest pain that is associated with shortness or breath, sweating, feeling sick to your stomach (nauseous), or throw up (vomit).  Your pain becomes localized to the abdomen.  You develop any new symptoms that seem different or that concern you. MAKE SURE YOU:   Understand these instructions.  Will watch your condition.  Will get help right away if you are not doing well or get worse. Document Released: 01/25/2005 Document Revised: 04/19/2011 Document Reviewed: 09/29/2012 [facility] Patient Information 2015 [place], [place]. This information is not intended to replace advice given to you by your health care provider. Make sure you discuss any questions you have with your health care provider. Pleurisy Pleurisy is an inflammation and swelling of the lining of the lungs (pleura). Because of this inflammation, it hurts to breathe. It can be aggravated by coughing, laughing, or deep breathing. Pleurisy is often caused by an underlying infection or disease.  HOME CARE INSTRUCTIONS  Monitor your pleurisy for any changes. The following actions may help to alleviate any discomfort you are experiencing:  Medicine may help with pain. Only take  over-the-counter or prescription medicines for pain, discomfort, or fever as directed by your health care provider.  Only take antibiotic medicine as directed. Make sure to finish it even if you start to feel better. SEEK MEDICAL CARE IF:   Your pain is not controlled with medicine or is increasing.  You have an increase in pus-like (purulent) secretions brought up with  coughing. SEEK IMMEDIATE MEDICAL CARE IF:   You have blue or dark lips, fingernails, or toenails.  You are coughing up blood.  You have increased difficulty breathing.  You have continuing pain unrelieved by medicine or pain lasting more than 1 week.  You have pain that radiates into your neck, arms, or jaw.  You develop increased shortness of breath or wheezing.  You develop a fever, rash, vomiting, fainting, or other serious symptoms. MAKE SURE YOU:  Understand these instructions.   Will watch your condition.   Will get help right away if you are not doing well or get worse.  Document Released: 01/25/2005 Document Revised: 09/27/2012 Document Reviewed: 07/09/2012 [facility] Patient Information 2015 [place], [place]. This information is not intended to replace advice given to you by your health care provider. Make sure you discuss any questions you have with your health care provider.  Smoking Cessation Quitting smoking is important to your health and has many advantages. However, it is not always easy to quit since nicotine is a very addictive drug. Oftentimes, people try 3 times or more before being able to quit. This document explains the best ways for you to prepare to quit smoking. Quitting takes hard work and a lot of effort, but you can do it. ADVANTAGES OF QUITTING SMOKING  You will live longer, feel better, and live better.  Your body will feel the impact of quitting smoking almost immediately.  Within 20 minutes, blood pressure decreases. Your pulse returns to its normal level.  After 8 hours, carbon monoxide levels in the blood return to normal. Your oxygen level increases.  After 24 hours, the chance of having a heart attack starts to decrease. Your breath, hair, and body stop smelling like smoke.  After 48 hours, damaged nerve endings begin to recover. Your sense of taste and smell improve.  After 72 hours, the body is virtually free of nicotine. Your  bronchial tubes relax and breathing becomes easier.  After 2 to 12 weeks, lungs can hold more air. Exercise becomes easier and circulation improves.  The risk of having a heart attack, stroke, cancer, or lung disease is greatly reduced.  After 1 year, the risk of coronary heart disease is cut in half.  After 5 years, the risk of stroke falls to the same as a nonsmoker.  After 10 years, the risk of lung cancer is cut in half and the risk of other cancers decreases significantly.  After 15 years, the risk of coronary heart disease drops, usually to the level of a nonsmoker.  If you are pregnant, quitting smoking will improve your chances of having a healthy baby.  The people you live with, especially any children, will be healthier.  You will have extra money to spend on things other than cigarettes. QUESTIONS TO THINK ABOUT BEFORE ATTEMPTING TO QUIT You may want to talk about your answers with your health care provider.  Why do you want to quit?  If you tried to quit in the past, what helped and what did not?  What will be the most difficult situations for you after you quit? How will  you plan to handle them?  Who can help you through the tough times? Your family? Friends? A health care provider?  What pleasures do you get from smoking? What ways can you still get pleasure if you quit? Here are some questions to ask your health care provider:  How can you help me to be successful at quitting?  What medicine do you think would be best for me and how should I take it?  What should I do if I need more help?  What is smoking withdrawal like? How can I get information on withdrawal? GET READY  Set a quit date.  Change your environment by getting rid of all cigarettes, ashtrays, matches, and lighters in your home, car, or work. Do not let people smoke in your home.  Review your past attempts to quit. Think about what worked and what did not. GET SUPPORT AND ENCOURAGEMENT You  have a better chance of being successful if you have help. You can get support in many ways.  Tell your family, friends, and coworkers that you are going to quit and need their support. Ask them not to smoke around you.  Get individual, group, or telephone counseling and support. Programs are available at [employer] and health centers. Call your local health department for information about programs in your area.  Spiritual beliefs and practices may help some smokers quit.  Download a "quit meter" on your computer to keep track of quit statistics, such as how long you have gone without smoking, cigarettes not smoked, and money saved.  Get a self-help book about quitting smoking and staying off tobacco. [place] yourself from urges to smoke. Talk to someone, go for a walk, or occupy your time with a task.  Change your normal routine. Take a different route to work. Drink tea instead of coffee. Eat breakfast in a different place.  Reduce your stress. Take a hot bath, exercise, or read a book.  Plan something enjoyable to do every day. Reward yourself for not smoking.  Explore interactive web-based programs that specialize in helping you quit. GET MEDICINE AND USE IT CORRECTLY Medicines can help you stop smoking and decrease the urge to smoke. Combining medicine with the above behavioral methods and support can greatly increase your chances of successfully quitting smoking.  Nicotine replacement therapy helps deliver nicotine to your body without the negative effects and risks of smoking. Nicotine replacement therapy includes nicotine gum, lozenges, inhalers, nasal sprays, and skin patches. Some may be available over-the-counter and others require a prescription.  Antidepressant medicine helps people abstain from smoking, but how this works is unknown. This medicine is available by prescription.  Nicotinic receptor partial agonist medicine simulates the  effect of nicotine in your brain. This medicine is available by prescription. Ask your health care provider for advice about which medicines to use and how to use them based on your health history. Your health care provider will tell you what side effects to look out for if you choose to be on a medicine or therapy. Carefully read the information on the package. Do not use any other product containing nicotine while using a nicotine replacement product.  RELAPSE OR DIFFICULT SITUATIONS Most relapses occur within the first 3 months after quitting. Do not be discouraged if you start smoking again. Remember, most people try several times before finally quitting. You may have symptoms of withdrawal because your body is used to nicotine. You may crave cigarettes, be  irritable, feel very hungry, cough often, get headaches, or have difficulty concentrating. The withdrawal symptoms are only temporary. They are strongest when you first quit, but they will go away within 10-14 days. To reduce the chances of relapse, try to:  Avoid drinking alcohol. Drinking lowers your chances of successfully quitting.  Reduce the amount of caffeine you consume. Once you quit smoking, the amount of caffeine in your body increases and can give you symptoms, such as a rapid heartbeat, sweating, and anxiety.  Avoid smokers because they can make you want to smoke.  Do not let weight gain distract you. Many smokers will gain weight when they quit, usually less than 10 pounds. Eat a healthy diet and stay active. You can always lose the weight gained after you quit.  Find ways to improve your mood other than smoking. FOR MORE INFORMATION  www.smokefree.gov  Document Released: 01/19/2001 Document Revised: 06/11/2013 Document Reviewed: 05/06/2011 [facility] Patient Information 2015 [place], [place]. This information is not intended to replace advice given to you by your health care provider. Make sure you discuss any questions you have  with your health care provider.

## 2013-09-09 NOTE — ED Provider Notes (Signed)
[place] of left anterior chest pain under left breast since she slid down in a chair while getting a pedicure yesterday. Pain is worse with deep inspiration or changing positions. Improved with remaining still. No treatment prior to coming here She also complains of low back pain which is worse with changing positions for approximately one week... Exam no distress. Lungs clear auscultation heart regular rate and rhythm chest is tender at left anterior chest wall, reproducing pain exactly. Low back pain is worse when she sits up in bed from a supine position. Back pain and chest pain felt to be musculoskeletal in etiology  Orlie Dakin, MD 09/09/13 1327

## 2013-09-10 ENCOUNTER — Telehealth: Payer: Self-pay | Admitting: Family Medicine

## 2013-09-10 ENCOUNTER — Encounter: Payer: Self-pay | Admitting: Family Medicine

## 2013-09-10 ENCOUNTER — Ambulatory Visit (INDEPENDENT_AMBULATORY_CARE_PROVIDER_SITE_OTHER): Payer: Federal, State, Local not specified - PPO | Admitting: Family Medicine

## 2013-09-10 VITALS — BP 110/82 | HR 87 | Temp 98.2°F | Ht 67.5 in

## 2013-09-10 DIAGNOSIS — M545 Low back pain, unspecified: Secondary | ICD-10-CM

## 2013-09-10 NOTE — Patient Instructions (Addendum)
-  heat for 15 minutes twice daily  -exercises as provided  -medications per instructions if needed  -advise stretching, walking every 30-45 minutes  -follow up in 3-4 weeks or sooner if needed

## 2013-09-10 NOTE — Progress Notes (Signed)
Pre visit review using our clinic review tool, if applicable. No additional management support is needed unless otherwise documented below in the visit note. 

## 2013-09-10 NOTE — ED Provider Notes (Signed)
CSN: [id number]     Arrival date & time 09/09/13  0254 History   First MD Initiated Contact with Patient 09/09/13 [phone]     Chief Complaint  Patient presents with  . Chest Pain  . Back Pain     (Consider location/radiation/quality/duration/timing/severity/associated sxs/prior Treatment) HPI  Rosella A Klett is a morbidly obese 53 y.o. female who presents to the ED of back and chest pain.  Patient states that she has  A history of chronic low back pain. Patient was getting a pedicure 2 das ago when she slid down in the chair. She twisted to the left to push herself up she felt a sharp pain on the left low back shooting up to the left Chest. She c/o low back pain that is worse with movement and twisting. She has pain standing upright . She also c/o associated pain on the left lateral chest wall that is worse with twisting and deep inspiration. Denies DOE, SOB, chest tightness or pressure, radiation to left arm, jaw or back, nausea, or diaphoresis. Denies exogenous estrogen use, lower extremity pain or swelling, recent travel or immobilization, history of PE or DVT, family or personal history of bleeding or clotting disorders, cough or hemoptysis. Denies weakness, loss of bowel/bladder function or saddle anesthesia. Denies neck stiffness, headache, rash.  Denies fever or recent procedures to back.     Past Medical History  Diagnosis Date  . Fibroid 04/03/2007  . Irregular menses   . H/O varicella   . History of measles, mumps, or rubella   . Monilial vaginitis 2006  . H/O menorrhagia 2007  . Urge incontinence 2008  . Hepatitis B 11/14/2006  . Pain pelvic   . H/O dysmenorrhea 2009  . History of nausea 2009    With menses   . SVD (spontaneous vaginal delivery)     x 1  . Allergy   . Seasonal allergies   . Arthritis     Rheumatoid - knees   Past Surgical History  Procedure Laterality Date  . Myomectomy abdominal approach  04/03/2007  . Dilation and curettage of uterus    . Knee  surgery    . Laparoscopic hysterectomy  09/20/2011    Procedure: HYSTERECTOMY TOTAL LAPAROSCOPIC;  Surgeon: Delice Lesch, MD;  Location: [facility];  Service: Gynecology;  Laterality: N/A;  . Bladder suspension  09/20/2011    Procedure: TRANSVAGINAL TAPE (TVT) PROCEDURE;  Surgeon: Delice Lesch, MD;  Location: [facility];  Service: Gynecology;  Laterality: N/A;  . Cystoscopy  09/20/2011    Procedure: CYSTOSCOPY;  Surgeon: Delice Lesch, MD;  Location: [facility];  Service: Gynecology;;  . Joint replacement      right knee  . Wisdom tooth extraction    . Transvaginal tape (tvt) removal N/A 11/10/2012    Procedure: TRANSVAGINAL TAPE (TVT) REMOVAL;  Surgeon: Delice Lesch, MD;  Location: [facility];  Service: Gynecology;  Laterality: N/A;  Removal of Mesh  . Cystoscopy N/A 11/10/2012    Procedure: CYSTOSCOPY;  Surgeon: Delice Lesch, MD;  Location: [facility];  Service: Gynecology;  Laterality: N/A;   Family History  Problem Relation Age of Onset  . Cancer Father     Lung  . Diabetes Sister   . Asthma Maternal Grandmother   . Diabetes Maternal Grandmother    History  Substance Use Topics  . Smoking status: Current Every Day Smoker -- 1.00 packs/day for 3 years    Types: Cigarettes    Last Attempt to  Quit: 03/14/2012  . Smokeless tobacco: Never Used  . Alcohol Use: Yes     Comment: socially   OB History   Grav Para Term Preterm Abortions TAB SAB Ect Mult Living   3 1   2  2   1      Review of Systems  Ten systems reviewed and are negative for acute change, except as noted in the HPI.    Allergies  Dairy aid; Shellfish allergy; Starch; and Cortisone  Home Medications   Prior to Admission medications   Medication Sig Start Date End Date Taking? Authorizing Provider  cyclobenzaprine (FLEXERIL) 10 MG tablet Take 1 tablet (10 mg total) by mouth 3 (three) times daily as needed for muscle spasms. 09/09/13   Margarita Mail, PA-C  naproxen (NAPROSYN) 500 MG tablet Take 1 tablet (500 mg  total) by mouth 2 (two) times daily with a meal. 09/09/13   Margarita Mail, PA-C  oxyCODONE (OXY IR/ROXICODONE) 5 MG immediate release tablet Take 1 tablet (5 mg total) by mouth every 4 (four) hours as needed for severe pain. 09/09/13   Margarita Mail, PA-C   BP 121/56  Pulse 56  Temp(Src) 98.9 F (37.2 C) (Oral)  Resp 15  Ht 5' 7.5" (1.715 m)  Wt 310 lb (140.615 kg)  BMI 47.81 kg/m2  SpO2 100%  LMP 08/07/2011  Physical Exam  Nursing note and vitals reviewed. Constitutional: She is oriented to person, place, and time. She appears well-developed and well-nourished. No distress.  HENT:  Head: Normocephalic and atraumatic.  Eyes: Conjunctivae normal and EOM are normal. Pupils are equal, round, and reactive to light. No scleral icterus.  Neck: Normal range of motion.  Cardiovascular: Normal rate, regular rhythm and normal heart sounds.  Exam reveals no gallop and no friction rub.   No murmur heard. Pulmonary/Chest: Effort normal and breath sounds normal. No respiratory distress. TTP left chest wall. Abdominal: Soft. Bowel sounds are normal. She exhibits no distension and no mass. There is no tenderness. There is no guarding. No CVA tenderness, Neurological: She is alert and oriented to person, place, and time.  Musculoskeletal:  Back exam: limited range of motion, pain with motion noted during exam, tenderness noted L lumbar paraspinals, sacroiliac joints and sciatic notches nontender, negative straight-leg raise bilaterally, normal reflexes and strength bilateral lower extremities, . Skin: Skin is warm and dry. She is not diaphoretic.    ED Course  Procedures (including critical care time) Labs Review Labs Reviewed  CBC WITH DIFFERENTIAL  BASIC METABOLIC PANEL  URINALYSIS, ROUTINE W REFLEX MICROSCOPIC    Imaging Review Dg Abd Acute W/chest  09/09/2013   CLINICAL DATA:  Right lower chest and bilateral lower back pain for the past 3 days. Smoker.  EXAM: ACUTE ABDOMEN SERIES (ABDOMEN 2  VIEW & CHEST 1 VIEW)  COMPARISON:  Abdomen ultrasound dated 02/15/2003 and chest radiographs dated 02/14/2013.  FINDINGS: Stable normal sized heart and clear lungs. The interstitial markings remain mildly prominent. Normal bowel gas pattern without free peritoneal air. Minimal lumbar scoliosis.  IMPRESSION: 1. No acute abnormality. 2. Stable mild chronic interstitial lung disease compatible with the history of smoking.   Electronically Signed   By: Enrique Sack M.D.   On: 09/09/2013 11:40     EKG Interpretation None     ECG interpretation   Date: 09/10/2013  Rate: 82  Rhythm: normal sinus rhythm  QRS Axis: normal  Intervals: normal  ST/T Wave abnormalities: normal  Conduction Disutrbances: none  Narrative Interpretation:   Old  EKG Reviewed: No significant changes noted     MDM   Final diagnoses:  Spasm of back muscles  Tobacco abuse    Patient here with apparent  Musculoskeletal pain. She has a  Normal EKG. CXR shows signs of chronic smoking. Negative labs and UA. Patient pain treated and significantly improved in the ED.  Patient seen in shared visit with Dr. Winfred Leeds. Patient counseled on smoking cessation. Discussed lab findings here. She is Wells low risk. No concern for ACS. No red flag sxs. Appears safe for discharge.     Margarita Mail, PA-C 09/10/13 2125

## 2013-09-10 NOTE — Progress Notes (Signed)
No chief complaint on file.   HPI:  Acute visit for:  1) Back pain: -started about 4-5 days ago after - pulling herself up out of a chair while getting a pedicure - felt like she pulled something in her L lower back -evaluated in ED with xrays of low back per her report and told muscle spasm - of note I do not see these films in the chart, provider note not yet finished either -pain is a 4/10, sharp pain, worse with certain movements -better with rest, not sure if pain medications are helping and doesn't like the way the oxycodone makes her feel -denies: fevers, weakness, numbness, bowel of bladder incontinence, CP -she thinks this is feeling better today after moving around a bit  ROS: See pertinent positives and negatives per HPI.  Past Medical History  Diagnosis Date  . Fibroid 04/03/2007  . Irregular menses   . H/O varicella   . History of measles, mumps, or rubella   . Monilial vaginitis 2006  . H/O menorrhagia 2007  . Urge incontinence 2008  . Hepatitis B 11/14/2006  . Pain pelvic   . H/O dysmenorrhea 2009  . History of nausea 2009    With menses   . SVD (spontaneous vaginal delivery)     x 1  . Allergy   . Seasonal allergies   . Arthritis     Rheumatoid - knees    Past Surgical History  Procedure Laterality Date  . Myomectomy abdominal approach  04/03/2007  . Dilation and curettage of uterus    . Knee surgery    . Laparoscopic hysterectomy  09/20/2011    Procedure: HYSTERECTOMY TOTAL LAPAROSCOPIC;  Surgeon: Delice Lesch, MD;  Location: [facility];  Service: Gynecology;  Laterality: N/A;  . Bladder suspension  09/20/2011    Procedure: TRANSVAGINAL TAPE (TVT) PROCEDURE;  Surgeon: Delice Lesch, MD;  Location: [facility];  Service: Gynecology;  Laterality: N/A;  . Cystoscopy  09/20/2011    Procedure: CYSTOSCOPY;  Surgeon: Delice Lesch, MD;  Location: [facility];  Service: Gynecology;;  . Joint replacement      right knee  . Wisdom tooth extraction    . Transvaginal  tape (tvt) removal N/A 11/10/2012    Procedure: TRANSVAGINAL TAPE (TVT) REMOVAL;  Surgeon: Delice Lesch, MD;  Location: [facility];  Service: Gynecology;  Laterality: N/A;  Removal of Mesh  . Cystoscopy N/A 11/10/2012    Procedure: CYSTOSCOPY;  Surgeon: Delice Lesch, MD;  Location: [facility];  Service: Gynecology;  Laterality: N/A;    Family History  Problem Relation Age of Onset  . Cancer Father     Lung  . Diabetes Sister   . Asthma Maternal Grandmother   . Diabetes Maternal Grandmother     History   Social History  . Marital Status: Single    Spouse Name: N/A    Number of Children: N/A  . Years of Education: N/A   Social History Main Topics  . Smoking status: Current Every Day Smoker -- 1.00 packs/day for 3 years    Types: Cigarettes    Last Attempt to Quit: 03/14/2012  . Smokeless tobacco: Never Used  . Alcohol Use: Yes     Comment: socially  . Drug Use: No  . Sexual Activity: Yes    Birth Control/ Protection: None     Comment: HYST    Other Topics Concern  . None   Social History Narrative  . None    Current outpatient  prescriptions:cyclobenzaprine (FLEXERIL) 10 MG tablet, Take 1 tablet (10 mg total) by mouth 3 (three) times daily as needed for muscle spasms., Disp: 30 tablet, Rfl: 0;  naproxen (NAPROSYN) 500 MG tablet, Take 1 tablet (500 mg total) by mouth 2 (two) times daily with a meal., Disp: 30 tablet, Rfl: 0 oxyCODONE (OXY IR/ROXICODONE) 5 MG immediate release tablet, Take 1 tablet (5 mg total) by mouth every 4 (four) hours as needed for severe pain., Disp: 10 tablet, Rfl: 0  EXAM:  Filed Vitals:   09/10/13 1411  BP: 110/82  Pulse: 87  Temp: 98.2 F (36.8 C)    Body mass index is 0.00 kg/(m^2).  GENERAL: vitals reviewed and listed above, alert, oriented, appears well hydrated and in no acute distress  HEENT: atraumatic, conjunttiva clear, no obvious abnormalities on inspection of external nose and ears  NECK: no obvious masses on  inspection  LUNGS: clear to auscultation bilaterally, no wheezes, rales or rhonchi, good air movement  CV: HRRR, no peripheral edema  MS: moves all extremities without noticeable abnormality Normal Gait Normal inspection of back, no obvious scoliosis or leg length descrepancy No bony TTP Soft tissue TTP at: L paraspinal lumbar musculature -/+ tests: neg trendelenburg,-facet loading, -SLRT, -CLRT, -FABER, -FADIR Normal muscle strength, sensation to light and vibratory touch in LE bilat  PSYCH: pleasant and cooperative, no obvious depression or anxiety  ASSESSMENT AND PLAN:  Discussed the following assessment and plan:  Left-sided low back pain without sciatica  -we discussed possible serious and likely etiologies, workup and treatment, treatment risks and return precautions - suspect muscular etiology  -after this discussion, Sandra Hawkins opted for conservative tx with heat, medications, HEP -follow up advised in 3-4 weeks or sooner if needed -of course, we advised Sandra Hawkins  to return or notify a doctor immediately if symptoms worsen or persist or new concerns arise.  -Patient advised to return or notify a doctor immediately if symptoms worsen or persist or new concerns arise.  Patient Instructions  -heat for 15 minutes twice daily  -exercises as provided  -medications per instructions if needed  -advise stretching, walking every 30-45 minutes  -follow up in 3-4 weeks or sooner if needed       Sandra Hawkins R.

## 2013-09-10 NOTE — Telephone Encounter (Signed)
Relevant patient education assigned to patient using Emmi. ° °

## 2013-09-11 NOTE — ED Provider Notes (Signed)
Medical screening examination/treatment/procedure(s) were conducted as a shared visit with non-physician practitioner(s) and myself.  I personally evaluated the patient during the encounter.   EKG Interpretation None       Orlie Dakin, MD 09/11/13 [phone]

## 2013-09-12 ENCOUNTER — Emergency Department (HOSPITAL_COMMUNITY)
Admission: EM | Admit: 2013-09-12 | Discharge: 2013-09-12 | Disposition: A | Discharge status: Home / Self Care | Payer: Federal, State, Local not specified - PPO | Attending: Emergency Medicine | Admitting: Emergency Medicine

## 2013-09-12 ENCOUNTER — Ambulatory Visit: Payer: Federal, State, Local not specified - PPO | Admitting: Family Medicine

## 2013-09-12 ENCOUNTER — Encounter (HOSPITAL_COMMUNITY): Payer: Self-pay | Admitting: Emergency Medicine

## 2013-09-12 DIAGNOSIS — M129 Arthropathy, unspecified: Secondary | ICD-10-CM | POA: Insufficient documentation

## 2013-09-12 DIAGNOSIS — Z8619 Personal history of other infectious and parasitic diseases: Secondary | ICD-10-CM | POA: Insufficient documentation

## 2013-09-12 DIAGNOSIS — M545 Low back pain: Secondary | ICD-10-CM

## 2013-09-12 DIAGNOSIS — M549 Dorsalgia, unspecified: Secondary | ICD-10-CM | POA: Insufficient documentation

## 2013-09-12 DIAGNOSIS — M5430 Sciatica, unspecified side: Secondary | ICD-10-CM | POA: Insufficient documentation

## 2013-09-12 DIAGNOSIS — F17200 Nicotine dependence, unspecified, uncomplicated: Secondary | ICD-10-CM | POA: Insufficient documentation

## 2013-09-12 DIAGNOSIS — Z79899 Other long term (current) drug therapy: Secondary | ICD-10-CM | POA: Insufficient documentation

## 2013-09-12 DIAGNOSIS — Z791 Long term (current) use of non-steroidal anti-inflammatories (NSAID): Secondary | ICD-10-CM | POA: Insufficient documentation

## 2013-09-12 DIAGNOSIS — Z8742 Personal history of other diseases of the female genital tract: Secondary | ICD-10-CM | POA: Insufficient documentation

## 2013-09-12 MED ORDER — OXYCODONE HCL 5 MG PO TABS
5.0000 mg | ORAL_TABLET | Freq: Once | ORAL | Status: AC
  Administered 2013-09-12: 5 mg via ORAL
  Filled 2013-09-12: qty 1

## 2013-09-12 MED ORDER — DIAZEPAM 5 MG PO TABS: 5.0000 mg | ORAL_TABLET | Freq: Two times a day (BID) | ORAL | Status: DC

## 2013-09-12 MED ORDER — OXYCODONE HCL 5 MG PO TABS: 5.0000 mg | ORAL_TABLET | ORAL | Status: DC | PRN

## 2013-09-12 MED ORDER — DIAZEPAM 5 MG PO TABS
5.0000 mg | ORAL_TABLET | Freq: Once | ORAL | Status: AC
  Administered 2013-09-12: 5 mg via ORAL
  Filled 2013-09-12: qty 1

## 2013-09-12 NOTE — ED Provider Notes (Signed)
CSN: [id number]     Arrival date & time 09/12/13  0916 History   First MD Initiated Contact with Patient 09/12/13 0920     Chief Complaint  Patient presents with  . Back Pain     (Consider location/radiation/quality/duration/timing/severity/associated sxs/prior Treatment) HPI Comments: Patient is a 53 year old female  who presents to the emergency department today with back pain. The pain started on Friday while she was getting a pedicure. She slid down in the chair and twisted her body. She has been having sharp right-sided back pain since that time. She states that today the pain began to radiate into her right leg. The pain is worse with certain movements and palpation. She has been able to take her pain medication with relief of her symptoms, but had to return to work today and was unable to take any pain medications. She has been compliant with taking the Flexeril, naproxen, oxycodone. She denies bowel or bladder incontinence, history of cancer, drug use, DVT, PE.  The history is provided by the patient. No language interpreter was used.    Past Medical History  Diagnosis Date  . Fibroid 04/03/2007  . Irregular menses   . H/O varicella   . History of measles, mumps, or rubella   . Monilial vaginitis 2006  . H/O menorrhagia 2007  . Urge incontinence 2008  . Hepatitis B 11/14/2006  . Pain pelvic   . H/O dysmenorrhea 2009  . History of nausea 2009    With menses   . SVD (spontaneous vaginal delivery)     x 1  . Allergy   . Seasonal allergies   . Arthritis     Rheumatoid - knees   Past Surgical History  Procedure Laterality Date  . Myomectomy abdominal approach  04/03/2007  . Dilation and curettage of uterus    . Knee surgery    . Laparoscopic hysterectomy  09/20/2011    Procedure: HYSTERECTOMY TOTAL LAPAROSCOPIC;  Surgeon: Delice Lesch, MD;  Location: [facility];  Service: Gynecology;  Laterality: N/A;  . Bladder suspension  09/20/2011    Procedure: TRANSVAGINAL TAPE (TVT)  PROCEDURE;  Surgeon: Delice Lesch, MD;  Location: [facility];  Service: Gynecology;  Laterality: N/A;  . Cystoscopy  09/20/2011    Procedure: CYSTOSCOPY;  Surgeon: Delice Lesch, MD;  Location: [facility];  Service: Gynecology;;  . Joint replacement      right knee  . Wisdom tooth extraction    . Transvaginal tape (tvt) removal N/A 11/10/2012    Procedure: TRANSVAGINAL TAPE (TVT) REMOVAL;  Surgeon: Delice Lesch, MD;  Location: [facility];  Service: Gynecology;  Laterality: N/A;  Removal of Mesh  . Cystoscopy N/A 11/10/2012    Procedure: CYSTOSCOPY;  Surgeon: Delice Lesch, MD;  Location: [facility];  Service: Gynecology;  Laterality: N/A;   Family History  Problem Relation Age of Onset  . Cancer Father     Lung  . Diabetes Sister   . Asthma Maternal Grandmother   . Diabetes Maternal Grandmother    History  Substance Use Topics  . Smoking status: Current Every Day Smoker -- 1.00 packs/day for 3 years    Types: Cigarettes    Last Attempt to Quit: 03/14/2012  . Smokeless tobacco: Never Used  . Alcohol Use: Yes     Comment: socially   OB History   Grav Para Term Preterm Abortions TAB SAB Ect Mult Living   3 1   2  2    1  Review of Systems  Constitutional: Negative for fever and chills.  Respiratory: Negative for shortness of breath.   Cardiovascular: Negative for chest pain.  Gastrointestinal: Negative for nausea, vomiting and abdominal pain.  Musculoskeletal: Positive for back pain and myalgias.  All other systems reviewed and are negative.     Allergies  Dairy aid; Shellfish allergy; Starch; and Cortisone  Home Medications   Prior to Admission medications   Medication Sig Start Date End Date Taking? Authorizing Provider  cyclobenzaprine (FLEXERIL) 10 MG tablet Take 1 tablet (10 mg total) by mouth 3 (three) times daily as needed for muscle spasms. 09/09/13   Margarita Mail, PA-C  naproxen (NAPROSYN) 500 MG tablet Take 1 tablet (500 mg total) by mouth 2 (two) times daily  with a meal. 09/09/13   Margarita Mail, PA-C  oxyCODONE (OXY IR/ROXICODONE) 5 MG immediate release tablet Take 1 tablet (5 mg total) by mouth every 4 (four) hours as needed for severe pain. 09/09/13   Margarita Mail, PA-C   BP 136/69  Pulse 74  Temp(Src) 98.4 F (36.9 C) (Oral)  Resp 22  SpO2 99%  LMP 08/07/2011 Physical Exam  Nursing note and vitals reviewed. Constitutional: She is oriented to person, place, and time. She appears well-developed and well-nourished. No distress.  No acute distress noted.   HENT:  Head: Normocephalic and atraumatic.  Right Ear: External ear normal.  Left Ear: External ear normal.  Nose: Nose normal.  Mouth/Throat: Oropharynx is clear and moist.  Eyes: Conjunctivae are normal.  Neck: Normal range of motion. No spinous process tenderness and no muscular tenderness present. No rigidity.  Cardiovascular: Normal rate, regular rhythm, normal heart sounds, intact distal pulses and normal pulses.   Pulses:      Radial pulses are 2+ on the right side, and 2+ on the left side.       Dorsalis pedis pulses are 2+ on the right side, and 2+ on the left side.       Posterior tibial pulses are 2+ on the right side, and 2+ on the left side.  Pulmonary/Chest: Effort normal and breath sounds normal. No stridor. No respiratory distress. She has no wheezes. She has no rales.  Abdominal: Soft. She exhibits no distension.  Musculoskeletal: Normal range of motion.       Back:  SLR negative bilaterally. Log roll test negative bilaterally  Neurological: She is alert and oriented to person, place, and time. She has normal strength. GCS eye subscore is 4. GCS verbal subscore is 5. GCS motor subscore is 6.  Reflex Scores:      Patellar reflexes are 2+ on the right side and 2+ on the left side. Skin: Skin is warm and dry. She is not diaphoretic. No erythema.  Psychiatric: She has a normal mood and affect. Her behavior is normal.    ED Course  Procedures (including critical care  time) Labs Review Labs Reviewed - No data to display  Imaging Review No results found.   EKG Interpretation None      MDM   Final diagnoses:  Right low back pain, with sciatica presence unspecified    Patient with back pain.  No neurological deficits and normal neuro exam.  Patient can walk but states is painful.  No loss of bowel or bladder control.  No concern for cauda equina.  No fever, night sweats, weight loss, h/o cancer, IVDU.  RICE protocol and pain medicine indicated and discussed with patient.      Elwyn Lade,  PA-C 09/12/13 1305

## 2013-09-12 NOTE — ED Notes (Signed)
Pt from home via [facility] with c/o ongoing back spasms, seen for same this past Sunday.  Pt states the medications she was given is not doing enough.  She was seen by her PCP for follow up who gave her back exercises but she has been unable to do them due to her pain.  Pt in NAD, A&O.

## 2013-09-12 NOTE — Discharge Instructions (Signed)
Back Pain, Adult Back pain is very common. The pain often gets better over time. The cause of back pain is usually not dangerous. Most people can learn to manage their back pain on their own.  HOME CARE   Stay active. Start with short walks on flat ground if you can. Try to walk farther each day.  Do not sit, drive, or stand in one place for more than 30 minutes. Do not stay in bed.  Do not avoid exercise or work. Activity can help your back heal faster.  Be careful when you bend or lift an object. Bend at your knees, keep the object close to you, and do not twist.  Sleep on a firm mattress. Lie on your side, and bend your knees. If you lie on your back, put a pillow under your knees.  Only take medicines as told by your doctor.  Put ice on the injured area.  Put ice in a plastic bag.  Place a towel between your skin and the bag.  Leave the ice on for 15-20 minutes, 03-04 times a day for the first 2 to 3 days. After that, you can switch between ice and heat packs.  Ask your doctor about back exercises or massage.  Avoid feeling anxious or stressed. Find good ways to deal with stress, such as exercise. GET HELP RIGHT AWAY IF:   Your pain does not go away with rest or medicine.  Your pain does not go away in 1 week.  You have new problems.  You do not feel well.  The pain spreads into your legs.  You cannot control when you poop (bowel movement) or pee (urinate).  Your arms or legs feel weak or lose feeling (numbness).  You feel sick to your stomach (nauseous) or throw up (vomit).  You have belly (abdominal) pain.  You feel like you may pass out (faint). MAKE SURE YOU:   Understand these instructions.  Will watch your condition.  Will get help right away if you are not doing well or get worse. Document Released: 07/14/2007 Document Revised: 04/19/2011 Document Reviewed: 05/29/2013 [facility] Patient Information 2015 [place], [place]. This information is not intended  to replace advice given to you by your health care provider. Make sure you discuss any questions you have with your health care provider.  Back Exercises Back exercises help treat and prevent back injuries. The goal is to increase your strength in your belly (abdominal) and back muscles. These exercises can also help with flexibility. Start these exercises when told by your doctor. HOME CARE Back exercises include: Pelvic Tilt.  Lie on your back with your knees bent. Tilt your pelvis until the lower part of your back is against the floor. Hold this position 5 to 10 sec. Repeat this exercise 5 to 10 times. Knee to Chest.  Pull 1 knee up against your chest and hold for 20 to 30 seconds. Repeat this with the other knee. This may be done with the other leg straight or bent, whichever feels better. Then, pull both knees up against your chest. Sit-Ups or Curl-Ups.  Bend your knees 90 degrees. Start with tilting your pelvis, and do a partial, slow sit-up. Only lift your upper half 30 to 45 degrees off the floor. Take at least 2 to 3 seonds for each sit-up. Do not do sit-ups with your knees out straight. If partial sit-ups are difficult, simply do the above but with only tightening your belly (abdominal) muscles and holding it as  told. Hip-Lift.  Lie on your back with your knees flexed 90 degrees. Push down with your feet and shoulders as you raise your hips 2 inches off the floor. Hold for 10 seconds, repeat 5 to 10 times. Back Arches.  Lie on your stomach. Prop yourself up on bent elbows. Slowly press on your hands, causing an arch in your low back. Repeat 3 to 5 times. Shoulder-Lifts.  Lie face down with arms beside your body. Keep hips and belly pressed to floor as you slowly lift your head and shoulders off the floor. Do not overdo your exercises. Be careful in the beginning. Exercises may cause you some mild back discomfort. If the pain lasts for more than 15 minutes, stop the exercises until  you see your doctor. Improvement with exercise for back problems is slow.  Document Released: 02/27/2010 Document Revised: 04/19/2011 Document Reviewed: 11/26/2010 [facility] Patient Information 2015 [place], [place]. This information is not intended to replace advice given to you by your health care provider. Make sure you discuss any questions you have with your health care provider.

## 2013-09-14 NOTE — ED Provider Notes (Signed)
Medical screening examination/treatment/procedure(s) were performed by non-physician practitioner and as supervising physician I was immediately available for consultation/collaboration.   EKG Interpretation None        Fredia Sorrow, MD 09/14/13 1331

## 2013-09-17 ENCOUNTER — Encounter (HOSPITAL_COMMUNITY): Payer: Self-pay | Admitting: Emergency Medicine

## 2013-09-17 ENCOUNTER — Telehealth: Payer: Self-pay | Admitting: Family Medicine

## 2013-09-17 ENCOUNTER — Emergency Department (HOSPITAL_COMMUNITY)
Admission: EM | Admit: 2013-09-17 | Discharge: 2013-09-17 | Disposition: A | Discharge status: Home / Self Care | Payer: Federal, State, Local not specified - PPO | Attending: Emergency Medicine | Admitting: Emergency Medicine

## 2013-09-17 DIAGNOSIS — M1710 Unilateral primary osteoarthritis, unspecified knee: Secondary | ICD-10-CM | POA: Diagnosis not present

## 2013-09-17 DIAGNOSIS — F17200 Nicotine dependence, unspecified, uncomplicated: Secondary | ICD-10-CM | POA: Diagnosis not present

## 2013-09-17 DIAGNOSIS — M545 Low back pain: Secondary | ICD-10-CM

## 2013-09-17 DIAGNOSIS — Z8739 Personal history of other diseases of the musculoskeletal system and connective tissue: Secondary | ICD-10-CM | POA: Insufficient documentation

## 2013-09-17 DIAGNOSIS — Z8619 Personal history of other infectious and parasitic diseases: Secondary | ICD-10-CM | POA: Diagnosis not present

## 2013-09-17 DIAGNOSIS — M5430 Sciatica, unspecified side: Secondary | ICD-10-CM | POA: Diagnosis not present

## 2013-09-17 DIAGNOSIS — Z8742 Personal history of other diseases of the female genital tract: Secondary | ICD-10-CM | POA: Insufficient documentation

## 2013-09-17 DIAGNOSIS — Z79899 Other long term (current) drug therapy: Secondary | ICD-10-CM | POA: Insufficient documentation

## 2013-09-17 DIAGNOSIS — IMO0002 Reserved for concepts with insufficient information to code with codable children: Secondary | ICD-10-CM

## 2013-09-17 MED ORDER — OXYCODONE-ACETAMINOPHEN 5-325 MG PO TABS
1.0000 | ORAL_TABLET | Freq: Once | ORAL | Status: AC
  Administered 2013-09-17: 1 via ORAL
  Filled 2013-09-17: qty 1

## 2013-09-17 NOTE — Telephone Encounter (Signed)
Pt scheduled  

## 2013-09-17 NOTE — ED Provider Notes (Signed)
Medical screening examination/treatment/procedure(s) were performed by non-physician practitioner and as supervising physician I was immediately available for consultation/collaboration.   EKG Interpretation None        Orpah Greek, MD 09/17/13 1622

## 2013-09-17 NOTE — Telephone Encounter (Signed)
Yes that's fine for a ED follow up

## 2013-09-17 NOTE — ED Provider Notes (Signed)
CSN: [id number]     Arrival date & time 09/17/13  0757 History   First MD Initiated Contact with Patient 09/17/13 [phone]     No chief complaint on file.    (Consider location/radiation/quality/duration/timing/severity/associated sxs/prior Treatment) HPI Comments: Patient is a 53 year old female presents emergency room chief complaint of persistent low back pain for one week. The patient reports onset of discomfort with sitting up while getting her headache here one week ago.  She reports she has been seen in the past for similar complaints denies new injury. The patient states she took her Flexeril and Valium today without resolution of symptoms. She reports compliance with oxycodone last night, has not taken naproxen since yesterday. She reports increase in discomfort with movement.  The history is provided by the patient. No language interpreter was used.    Past Medical History  Diagnosis Date  . Fibroid 04/03/2007  . Irregular menses   . H/O varicella   . History of measles, mumps, or rubella   . Monilial vaginitis 2006  . H/O menorrhagia 2007  . Urge incontinence 2008  . Hepatitis B 11/14/2006  . Pain pelvic   . H/O dysmenorrhea 2009  . History of nausea 2009    With menses   . SVD (spontaneous vaginal delivery)     x 1  . Allergy   . Seasonal allergies   . Arthritis     Rheumatoid - knees   Past Surgical History  Procedure Laterality Date  . Myomectomy abdominal approach  04/03/2007  . Dilation and curettage of uterus    . Knee surgery    . Laparoscopic hysterectomy  09/20/2011    Procedure: HYSTERECTOMY TOTAL LAPAROSCOPIC;  Surgeon: Delice Lesch, MD;  Location: [facility];  Service: Gynecology;  Laterality: N/A;  . Bladder suspension  09/20/2011    Procedure: TRANSVAGINAL TAPE (TVT) PROCEDURE;  Surgeon: Delice Lesch, MD;  Location: [facility];  Service: Gynecology;  Laterality: N/A;  . Cystoscopy  09/20/2011    Procedure: CYSTOSCOPY;  Surgeon: Delice Lesch, MD;   Location: [facility];  Service: Gynecology;;  . Joint replacement      right knee  . Wisdom tooth extraction    . Transvaginal tape (tvt) removal N/A 11/10/2012    Procedure: TRANSVAGINAL TAPE (TVT) REMOVAL;  Surgeon: Delice Lesch, MD;  Location: [facility];  Service: Gynecology;  Laterality: N/A;  Removal of Mesh  . Cystoscopy N/A 11/10/2012    Procedure: CYSTOSCOPY;  Surgeon: Delice Lesch, MD;  Location: [facility];  Service: Gynecology;  Laterality: N/A;   Family History  Problem Relation Age of Onset  . Cancer Father     Lung  . Diabetes Sister   . Asthma Maternal Grandmother   . Diabetes Maternal Grandmother    History  Substance Use Topics  . Smoking status: Current Every Day Smoker -- 1.00 packs/day for 3 years    Types: Cigarettes    Last Attempt to Quit: 03/14/2012  . Smokeless tobacco: Never Used  . Alcohol Use: Yes     Comment: socially   OB History   Grav Para Term Preterm Abortions TAB SAB Ect Mult Living   3 1   2  2   1      Review of Systems  Constitutional: Negative for fever and chills.  Musculoskeletal: Positive for back pain.  Neurological: Negative for weakness and numbness.      Allergies  Dairy aid; Shellfish allergy; Starch; and Cortisone  Home Medications  Prior to Admission medications   Medication Sig Start Date End Date Taking? Authorizing Provider  cyclobenzaprine (FLEXERIL) 10 MG tablet Take 10 mg by mouth 3 (three) times daily as needed for muscle spasms.   Yes Historical Provider, MD  diazepam (VALIUM) 5 MG tablet Take 5 mg by mouth 2 (two) times daily.   Yes Historical Provider, MD  naproxen (NAPROSYN) 500 MG tablet Take 500 mg by mouth 2 (two) times daily with a meal.   Yes Historical Provider, MD  oxyCODONE (OXY IR/ROXICODONE) 5 MG immediate release tablet Take 5 mg by mouth every 4 (four) hours as needed for severe pain.   Yes Historical Provider, MD   BP 107/65  Pulse 77  Temp(Src) 97.8 F (36.6 C) (Oral)  Resp 20  SpO2 99%  LMP  08/07/2011 Physical Exam  Nursing note and vitals reviewed. Constitutional: She is oriented to person, place, and time. She appears well-developed and well-nourished. No distress.  HENT:  Head: Normocephalic and atraumatic.  Neck: Neck supple.  Pulmonary/Chest: Effort normal. No respiratory distress.  Musculoskeletal:       Back:  Good strength and sensation to touch, equal bilaterally.  Neurological: She is alert and oriented to person, place, and time.  Skin: Skin is warm and dry. She is not diaphoretic.  Psychiatric: She has a normal mood and affect. Her behavior is normal.    ED Course  Procedures (including critical care time) Labs Review Labs Reviewed - No data to display  Imaging Review No results found.   EKG Interpretation None      MDM   Final diagnoses:  Right low back pain, with sciatica presence unspecified   Patient presents with low back pain, seen twice over the last 5 days for similar complaints. Noncompliant with anti-inflammatory. Reports taking the Valium and Flexeril, appears exam. Discomfort reproducible with palpation. Multiple pill bottles with medication remaining, will not refill medication at this time. Patient can walk but states is painful. No loss of bowel or bladder control. No concern for cauda equina. No fever, night sweats, weight loss, h/o cancer, IVDU. RICE protocol and pain medicine indicated and discussed with patient.   Meds given in ED:  Medications  oxyCODONE-acetaminophen (PERCOCET/ROXICET) 5-325 MG per tablet 1 tablet (1 tablet Oral Given 09/17/13 0823)    Discharge Medication List as of 09/17/2013  8:55 AM        Harvie Heck, PA-C 09/17/13 1014

## 2013-09-17 NOTE — Telephone Encounter (Signed)
Pt has been having a lot of back issues and has had several visits to the ER. She don't want to see no one but Dr Elease Hashimoto . She need a 30 minute visit can I use Thursday 09/20/13 9;30 same day and acute to make 30 minute

## 2013-09-17 NOTE — Discharge Instructions (Signed)
Call for a follow up appointment with a Family or Primary Care Provider.  Return if Symptoms worsen.   Take medication as prescribed.  Do not take the Valium and Flexeril together. Use ice and heat to your back.

## 2013-09-17 NOTE — ED Notes (Signed)
C/O LBP x 1 week. Was seen here last week for same. Put on Flexeril, Valium, Percocet and Naproxin. States pain has waxed and waned. Tried to go to work this am but pain became too severe. Had difficulty transferring to bed due to pain.

## 2013-09-20 ENCOUNTER — Ambulatory Visit: Payer: Federal, State, Local not specified - PPO | Admitting: Family Medicine

## 2013-09-24 ENCOUNTER — Ambulatory Visit: Payer: Federal, State, Local not specified - PPO | Admitting: Family Medicine

## 2013-10-03 ENCOUNTER — Ambulatory Visit (INDEPENDENT_AMBULATORY_CARE_PROVIDER_SITE_OTHER): Payer: Federal, State, Local not specified - PPO | Admitting: Family Medicine

## 2013-10-03 ENCOUNTER — Encounter: Payer: Self-pay | Admitting: Family Medicine

## 2013-10-03 ENCOUNTER — Ambulatory Visit: Payer: Federal, State, Local not specified - PPO | Admitting: Family Medicine

## 2013-10-03 VITALS — BP 112/70 | HR 79 | Temp 97.9°F | Wt 207.0 lb

## 2013-10-03 DIAGNOSIS — M545 Low back pain, unspecified: Secondary | ICD-10-CM

## 2013-10-03 DIAGNOSIS — M533 Sacrococcygeal disorders, not elsewhere classified: Secondary | ICD-10-CM

## 2013-10-03 DIAGNOSIS — M549 Dorsalgia, unspecified: Secondary | ICD-10-CM

## 2013-10-03 LAB — POCT URINALYSIS DIPSTICK
BILIRUBIN UA: NEGATIVE
GLUCOSE UA: NEGATIVE
KETONES UA: NEGATIVE
NITRITE UA: NEGATIVE
RBC UA: NEGATIVE
SPEC GRAV UA: 1.025
Urobilinogen, UA: 0.2
pH, UA: 5

## 2013-10-03 MED ORDER — NAPROXEN 500 MG PO TABS: 500.0000 mg | ORAL_TABLET | Freq: Two times a day (BID) | ORAL | Status: DC

## 2013-10-03 NOTE — Progress Notes (Signed)
Subjective:    Patient ID: Sandra Hawkins, female    DOB: 1961/01/21, 53 y.o.   MRN: [id number]  Back Pain Associated symptoms include dysuria. Pertinent negatives include no abdominal pain, fever, numbness or weakness.   Patient seen for followup low back pain. She's had 3 emergency department visits and one visit here over the past month. No specific injury. Location is mostly right lower lumbar. No radiculopathy. Quality is achy pain. 8 out 10 severity at times. Worse with change of position. No numbness or weakness. No loss of bladder or bowel control. She has been prescribed multiple medications including naproxen, Flexeril, diazepam, and oxycodone. She does get some relief with naproxen. She complains of mild dysuria with occasional mild burning at the end of urination but not consistently. No fevers or chills. No flank pain.  Past Medical History  Diagnosis Date  . Fibroid 04/03/2007  . Irregular menses   . H/O varicella   . History of measles, mumps, or rubella   . Monilial vaginitis 2006  . H/O menorrhagia 2007  . Urge incontinence 2008  . Hepatitis B 11/14/2006  . Pain pelvic   . H/O dysmenorrhea 2009  . History of nausea 2009    With menses   . SVD (spontaneous vaginal delivery)     x 1  . Allergy   . Seasonal allergies   . Arthritis     Rheumatoid - knees   Past Surgical History  Procedure Laterality Date  . Myomectomy abdominal approach  04/03/2007  . Dilation and curettage of uterus    . Knee surgery    . Laparoscopic hysterectomy  09/20/2011    Procedure: HYSTERECTOMY TOTAL LAPAROSCOPIC;  Surgeon: Delice Lesch, MD;  Location: [facility];  Service: Gynecology;  Laterality: N/A;  . Bladder suspension  09/20/2011    Procedure: TRANSVAGINAL TAPE (TVT) PROCEDURE;  Surgeon: Delice Lesch, MD;  Location: [facility];  Service: Gynecology;  Laterality: N/A;  . Cystoscopy  09/20/2011    Procedure: CYSTOSCOPY;  Surgeon: Delice Lesch, MD;  Location: [facility];  Service:  Gynecology;;  . Joint replacement      right knee  . Wisdom tooth extraction    . Transvaginal tape (tvt) removal N/A 11/10/2012    Procedure: TRANSVAGINAL TAPE (TVT) REMOVAL;  Surgeon: Delice Lesch, MD;  Location: [facility];  Service: Gynecology;  Laterality: N/A;  Removal of Mesh  . Cystoscopy N/A 11/10/2012    Procedure: CYSTOSCOPY;  Surgeon: Delice Lesch, MD;  Location: [facility];  Service: Gynecology;  Laterality: N/A;    reports that she has been smoking Cigarettes.  She has a 3 pack-year smoking history. She has never used smokeless tobacco. She reports that she drinks alcohol. She reports that she does not use illicit drugs. family history includes Asthma in her maternal grandmother; Cancer in her father; Diabetes in her maternal grandmother and sister. Allergies  Allergen Reactions  . Dairy Aid [Lactase] Other (See Comments)    bloating  . Shellfish Allergy Hives  . Starch Other (See Comments)    bloating  . Cortisone Rash      Review of Systems  Constitutional: Negative for fever and chills.  Gastrointestinal: Negative for abdominal pain.  Genitourinary: Positive for dysuria.  Musculoskeletal: Positive for back pain.  Neurological: Negative for weakness and numbness.       Objective:   Physical Exam  Constitutional: She appears well-developed and well-nourished.  Cardiovascular: Normal rate and regular rhythm.   Pulmonary/Chest: Effort  normal and breath sounds normal. No respiratory distress. She has no wheezes. She has no rales.  Musculoskeletal: She exhibits no edema.  Neurological:  Straight leg raises are negative bilaterally. She has trace reflexes knee and ankle bilaterally. Full-strength lower extremities. Normal sensory function.          Assessment & Plan:  Right lumbar back pain. Nonfocal neurologic exam. Given duration, set up physical therapy. If no improvement with that consider lumbar films. Continue naproxen as needed. Avoid opioids. Continue  nighttime use of muscle relaxer as needed

## 2013-10-03 NOTE — Patient Instructions (Signed)

## 2013-10-03 NOTE — Progress Notes (Signed)
Pre visit review using our clinic review tool, if applicable. No additional management support is needed unless otherwise documented below in the visit note. 

## 2013-10-04 LAB — URINE CULTURE
Colony Count: NO GROWTH
ORGANISM ID, BACTERIA: NO GROWTH

## 2013-10-05 ENCOUNTER — Ambulatory Visit: Payer: Federal, State, Local not specified - PPO | Admitting: Family Medicine

## 2013-10-08 ENCOUNTER — Ambulatory Visit (AMBULATORY_SURGERY_CENTER): Payer: Federal, State, Local not specified - PPO

## 2013-10-08 VITALS — Ht 67.5 in | Wt 204.0 lb

## 2013-10-08 DIAGNOSIS — Z1211 Encounter for screening for malignant neoplasm of colon: Secondary | ICD-10-CM

## 2013-10-08 MED ORDER — MOVIPREP 100 G PO SOLR: 1.0000 | Freq: Once | ORAL | Status: DC

## 2013-10-08 NOTE — Progress Notes (Signed)
No allergies to eggs or soy No past problems with anesthesia No home oxygen No diet/weight loss meds  Has email  Emmi instructions given for colonoscopy 

## 2013-10-10 ENCOUNTER — Ambulatory Visit: Payer: Federal, State, Local not specified - PPO | Attending: Family Medicine

## 2013-10-10 DIAGNOSIS — R5381 Other malaise: Secondary | ICD-10-CM | POA: Insufficient documentation

## 2013-10-10 DIAGNOSIS — Z96659 Presence of unspecified artificial knee joint: Secondary | ICD-10-CM | POA: Diagnosis not present

## 2013-10-10 DIAGNOSIS — IMO0001 Reserved for inherently not codable concepts without codable children: Secondary | ICD-10-CM | POA: Insufficient documentation

## 2013-10-10 DIAGNOSIS — M545 Low back pain, unspecified: Secondary | ICD-10-CM | POA: Diagnosis not present

## 2013-10-10 DIAGNOSIS — M549 Dorsalgia, unspecified: Secondary | ICD-10-CM | POA: Insufficient documentation

## 2013-10-22 ENCOUNTER — Ambulatory Visit: Payer: Federal, State, Local not specified - PPO | Admitting: Physical Therapy

## 2013-10-22 DIAGNOSIS — IMO0001 Reserved for inherently not codable concepts without codable children: Secondary | ICD-10-CM | POA: Diagnosis not present

## 2013-10-24 ENCOUNTER — Ambulatory Visit (AMBULATORY_SURGERY_CENTER): Payer: Federal, State, Local not specified - PPO | Admitting: Gastroenterology

## 2013-10-24 ENCOUNTER — Encounter: Payer: Self-pay | Admitting: Gastroenterology

## 2013-10-24 VITALS — BP 125/80 | HR 50 | Temp 97.3°F | Resp 15 | Ht 67.5 in | Wt 204.0 lb

## 2013-10-24 DIAGNOSIS — Z1211 Encounter for screening for malignant neoplasm of colon: Secondary | ICD-10-CM

## 2013-10-24 DIAGNOSIS — D126 Benign neoplasm of colon, unspecified: Secondary | ICD-10-CM

## 2013-10-24 DIAGNOSIS — D122 Benign neoplasm of ascending colon: Secondary | ICD-10-CM

## 2013-10-24 MED ORDER — SODIUM CHLORIDE 0.9 % IV SOLN: 500.0000 mL | INTRAVENOUS | Status: DC

## 2013-10-24 NOTE — Op Note (Signed)
[place]  [address] [place], [id number]   COLONOSCOPY PROCEDURE REPORT  PATIENT: Sandra Hawkins, Sandra Hawkins  MR#: [id number] BIRTHDATE: 01/09/1961 , 53  yrs. old GENDER: Female ENDOSCOPIST: Ladene Artist, MD, [facility] REFERRED VQ:[id number] Elease Hashimoto, M.D. PROCEDURE DATE:  10/24/2013 PROCEDURE:   Colonoscopy with snare polypectomy First Screening Colonoscopy - Avg.  risk and is 50 yrs.  old or older Yes.  Prior Negative Screening - Now for repeat screening. N/A  History of Adenoma - Now for follow-up colonoscopy & has been > or = to 3 yrs.  N/A  Polyps Removed Today? Yes. ASA CLASS:   Class II INDICATIONS:average risk screening. MEDICATIONS: MAC sedation, administered by CRNA and propofol (Diprivan) 200mg  IV DESCRIPTION OF PROCEDURE:   After the risks benefits and alternatives of the procedure were thoroughly explained, informed consent was obtained.  A digital rectal exam revealed no abnormalities of the rectum.   The LB [id number]  endoscope was introduced through the anus and advanced to the cecum, which was identified by both the appendix and ileocecal valve. No adverse events experienced.   The quality of the prep was good, using MoviPrep  The instrument was then slowly withdrawn as the colon was fully examined.  COLON FINDINGS: A sessile polyp measuring 6 mm in size was found in the ascending colon.  A polypectomy was performed with a cold snare.  The resection was complete and the polyp tissue was completely retrieved.   Moderate melanosis was found throughout the entire examined colon, right colon greater than left colon.   The colon was otherwise normal.  There was no diverticulosis, inflammation, polyps or cancers unless previously stated. Retroflexed views revealed no abnormalities. The time to cecum=1 minutes 31 seconds.  Withdrawal time=8 minutes 15 seconds.  The scope was withdrawn and the procedure completed. COMPLICATIONS: There were no  complications.  ENDOSCOPIC IMPRESSION: 1.   Sessile polyp measuring 6 mm in the ascending colon; polypectomy performed with a cold snare 2.   Moderate melanosis throughout the entire examined colon  RECOMMENDATIONS: 1.  Await pathology results 2.  Repeat colonoscopy in 5 years if polyp adenomatous; otherwise 10 years  eSigned:  Ladene Artist, MD, [facility] 10/24/2013 9:54 AM

## 2013-10-24 NOTE — Progress Notes (Signed)
Patient awakening,vss,report to rn 

## 2013-10-24 NOTE — Progress Notes (Signed)
Called to room to assist during endoscopic procedure.  Patient ID and intended procedure confirmed with present staff. Received instructions for my participation in the procedure from the performing physician.  

## 2013-10-24 NOTE — Patient Instructions (Signed)
YOU HAD AN ENDOSCOPIC PROCEDURE TODAY AT THE [facility]: Refer to the procedure report that was given to you for any specific questions about what was found during the examination.  If the procedure report does not answer your questions, please call your gastroenterologist to clarify.  If you requested that your care partner not be given the details of your procedure findings, then the procedure report has been included in a sealed envelope for you to review at your convenience later.  YOU SHOULD EXPECT: Some feelings of bloating in the abdomen. Passage of more gas than usual.  Walking can help get rid of the air that was put into your GI tract during the procedure and reduce the bloating. If you had a lower endoscopy (such as a colonoscopy or flexible sigmoidoscopy) you may notice spotting of blood in your stool or on the toilet paper. If you underwent a bowel prep for your procedure, then you may not have a normal bowel movement for a few days.  DIET: Your first meal following the procedure should be a light meal and then it is ok to progress to your normal diet.  A half-sandwich or bowl of soup is an example of a good first meal.  Heavy or fried foods are harder to digest and may make you feel nauseous or bloated.  Likewise meals heavy in dairy and vegetables can cause extra gas to form and this can also increase the bloating.  Drink plenty of fluids but you should avoid alcoholic beverages for 24 hours.  ACTIVITY: Your care partner should take you home directly after the procedure.  You should plan to take it easy, moving slowly for the rest of the day.  You can resume normal activity the day after the procedure however you should NOT DRIVE or use heavy machinery for 24 hours (because of the sedation medicines used during the test).    SYMPTOMS TO REPORT IMMEDIATELY: A gastroenterologist can be reached at any hour.  During normal business hours, 8:30 AM to 5:00 PM Monday through Friday,  call [phone].  After hours and on weekends, please call the GI answering service at [phone] who will take a message and have the physician on call contact you.   Following lower endoscopy (colonoscopy or flexible sigmoidoscopy):  Excessive amounts of blood in the stool  Significant tenderness or worsening of abdominal pains  Swelling of the abdomen that is new, acute  Fever of 100F or higher  FOLLOW UP: If any biopsies were taken you will be contacted by phone or by letter within the next 1-3 weeks.  Call your gastroenterologist if you have not heard about the biopsies in 3 weeks.  Our staff will call the home number listed on your records the next business day following your procedure to check on you and address any questions or concerns that you may have at that time regarding the information given to you following your procedure. This is a courtesy call and so if there is no answer at the home number and we have not heard from you through the emergency physician on call, we will assume that you have returned to your regular daily activities without incident.  SIGNATURES/CONFIDENTIALITY: You and/or your care partner have signed paperwork which will be entered into your electronic medical record.  These signatures attest to the fact that that the information above on your After Visit Summary has been reviewed and is understood.  Full responsibility of the confidentiality of this   discharge information lies with you and/or your care-partner.    Resume medications. Information given on polyps with discharge instructions. 

## 2013-10-25 ENCOUNTER — Telehealth: Payer: Self-pay | Admitting: *Deleted

## 2013-10-25 ENCOUNTER — Telehealth: Payer: Self-pay | Admitting: Gastroenterology

## 2013-10-25 NOTE — Telephone Encounter (Signed)
Returned phone call to patient, she is complaining of feeling weak and cold, just overall not feeling well. Took temperature and it was 98.2. Informed patient that feeling a bit weak and chilly is not uncommon with prep, symptoms did not seem uncommon or out of the ordinary unless she may be developing some sort of viral episode. Encouraged her to drink warm fluids, eat something more and see how she feels later today. To either call us back if symptoms worsen or do not improve or may need to follow through with her primary care provider.

## 2013-10-25 NOTE — Telephone Encounter (Signed)
Message left

## 2013-10-30 ENCOUNTER — Encounter: Payer: Self-pay | Admitting: Gastroenterology

## 2013-10-30 ENCOUNTER — Ambulatory Visit: Payer: Federal, State, Local not specified - PPO | Admitting: Physical Therapy

## 2013-10-30 DIAGNOSIS — IMO0001 Reserved for inherently not codable concepts without codable children: Secondary | ICD-10-CM | POA: Diagnosis not present

## 2013-11-01 ENCOUNTER — Other Ambulatory Visit: Payer: Self-pay | Admitting: Obstetrics and Gynecology

## 2013-11-01 DIAGNOSIS — N6325 Unspecified lump in the left breast, overlapping quadrants: Secondary | ICD-10-CM

## 2013-11-01 DIAGNOSIS — N6320 Unspecified lump in the left breast, unspecified quadrant: Principal | ICD-10-CM

## 2013-11-06 ENCOUNTER — Ambulatory Visit: Payer: Federal, State, Local not specified - PPO | Admitting: Physical Therapy

## 2013-11-06 DIAGNOSIS — IMO0001 Reserved for inherently not codable concepts without codable children: Secondary | ICD-10-CM | POA: Diagnosis not present

## 2013-11-07 ENCOUNTER — Ambulatory Visit
Admission: RE | Admit: 2013-11-07 | Discharge: 2013-11-07 | Disposition: A | Discharge status: Home / Self Care | Payer: Federal, State, Local not specified - PPO | Source: Ambulatory Visit | Attending: Obstetrics and Gynecology | Admitting: Obstetrics and Gynecology

## 2013-11-07 DIAGNOSIS — N6320 Unspecified lump in the left breast, unspecified quadrant: Principal | ICD-10-CM

## 2013-11-07 DIAGNOSIS — N6325 Unspecified lump in the left breast, overlapping quadrants: Secondary | ICD-10-CM

## 2013-11-13 ENCOUNTER — Ambulatory Visit: Payer: Federal, State, Local not specified - PPO | Admitting: Physical Therapy

## 2013-11-20 ENCOUNTER — Ambulatory Visit: Payer: Federal, State, Local not specified - PPO

## 2013-12-02 ENCOUNTER — Emergency Department (HOSPITAL_COMMUNITY): Payer: Federal, State, Local not specified - PPO

## 2013-12-02 ENCOUNTER — Emergency Department (HOSPITAL_COMMUNITY)
Admission: EM | Admit: 2013-12-02 | Discharge: 2013-12-02 | Disposition: A | Discharge status: Home / Self Care | Payer: Federal, State, Local not specified - PPO | Attending: Emergency Medicine | Admitting: Emergency Medicine

## 2013-12-02 ENCOUNTER — Encounter (HOSPITAL_COMMUNITY): Payer: Self-pay | Admitting: Emergency Medicine

## 2013-12-02 DIAGNOSIS — S4992XA Unspecified injury of left shoulder and upper arm, initial encounter: Secondary | ICD-10-CM | POA: Insufficient documentation

## 2013-12-02 DIAGNOSIS — Y9389 Activity, other specified: Secondary | ICD-10-CM | POA: Insufficient documentation

## 2013-12-02 DIAGNOSIS — Z8709 Personal history of other diseases of the respiratory system: Secondary | ICD-10-CM | POA: Diagnosis not present

## 2013-12-02 DIAGNOSIS — Z791 Long term (current) use of non-steroidal anti-inflammatories (NSAID): Secondary | ICD-10-CM | POA: Insufficient documentation

## 2013-12-02 DIAGNOSIS — Y92410 Unspecified street and highway as the place of occurrence of the external cause: Secondary | ICD-10-CM | POA: Insufficient documentation

## 2013-12-02 DIAGNOSIS — Z87891 Personal history of nicotine dependence: Secondary | ICD-10-CM | POA: Diagnosis not present

## 2013-12-02 DIAGNOSIS — Z8739 Personal history of other diseases of the musculoskeletal system and connective tissue: Secondary | ICD-10-CM | POA: Insufficient documentation

## 2013-12-02 DIAGNOSIS — Z8619 Personal history of other infectious and parasitic diseases: Secondary | ICD-10-CM | POA: Insufficient documentation

## 2013-12-02 DIAGNOSIS — Z8742 Personal history of other diseases of the female genital tract: Secondary | ICD-10-CM | POA: Diagnosis not present

## 2013-12-02 DIAGNOSIS — Z79899 Other long term (current) drug therapy: Secondary | ICD-10-CM | POA: Insufficient documentation

## 2013-12-02 MED ORDER — IBUPROFEN 800 MG PO TABS: 800.0000 mg | ORAL_TABLET | Freq: Three times a day (TID) | ORAL | Status: DC

## 2013-12-02 MED ORDER — TRAMADOL HCL 50 MG PO TABS: 50.0000 mg | ORAL_TABLET | Freq: Four times a day (QID) | ORAL | Status: DC | PRN

## 2013-12-02 MED ORDER — ACETAMINOPHEN 500 MG PO TABS
1000.0000 mg | ORAL_TABLET | Freq: Once | ORAL | Status: AC
  Administered 2013-12-02: 1000 mg via ORAL
  Filled 2013-12-02: qty 2
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
## 2013-12-02 NOTE — ED Notes (Signed)
MD at bedside. 
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
## 2013-12-02 NOTE — ED Notes (Signed)
Patient transported to X-ray 

## 2013-12-05 ENCOUNTER — Encounter: Payer: Self-pay | Admitting: Family Medicine

## 2013-12-05 ENCOUNTER — Ambulatory Visit (INDEPENDENT_AMBULATORY_CARE_PROVIDER_SITE_OTHER): Payer: Federal, State, Local not specified - PPO | Admitting: Family Medicine

## 2013-12-05 VITALS — BP 116/78 | HR 74 | Temp 97.8°F | Wt 206.0 lb

## 2013-12-05 DIAGNOSIS — M545 Low back pain, unspecified: Secondary | ICD-10-CM

## 2013-12-05 MED ORDER — CYCLOBENZAPRINE HCL 10 MG PO TABS: 10.0000 mg | ORAL_TABLET | Freq: Three times a day (TID) | ORAL | Status: DC | PRN
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
## 2013-12-05 NOTE — Patient Instructions (Signed)
Back Pain, Adult Low back pain is very common. About 1 in 5 people have back pain.The cause of low back pain is rarely dangerous. The pain often gets better over time.About half of people with a sudden onset of back pain feel better in just 2 weeks. About 8 in 10 people feel better by 6 weeks.  CAUSES Some common causes of back pain include:  Strain of the muscles or ligaments supporting the spine.  Wear and tear (degeneration) of the spinal discs.  Arthritis.  Direct injury to the back. DIAGNOSIS Most of the time, the direct cause of low back pain is not known.However, back pain can be treated effectively even when the exact cause of the pain is unknown.Answering your caregiver's questions about your overall health and symptoms is one of the most accurate ways to make sure the cause of your pain is not dangerous. If your caregiver needs more information, he or she may order lab work or imaging tests (X-rays or MRIs).However, even if imaging tests show changes in your back, this usually does not require surgery. HOME CARE INSTRUCTIONS For many people, back pain returns.Since low back pain is rarely dangerous, it is often a condition that people can learn to manageon their own.   Remain active. It is stressful on the back to sit or stand in one place. Do not sit, drive, or stand in one place for more than 30 minutes at a time. Take short walks on level surfaces as soon as pain allows.Try to increase the length of time you walk each day.  Do not stay in bed.Resting more than 1 or 2 days can delay your recovery.  Do not avoid exercise or work.Your body is made to move.It is not dangerous to be active, even though your back may hurt.Your back will likely heal faster if you return to being active before your pain is gone.  Pay attention to your body when you bend and lift. Many people have less discomfortwhen lifting if they bend their knees, keep the load close to their bodies,and  avoid twisting. Often, the most comfortable positions are those that put less stress on your recovering back.  Find a comfortable position to sleep. Use a firm mattress and lie on your side with your knees slightly bent. If you lie on your back, put a pillow under your knees.  Only take over-the-counter or prescription medicines as directed by your caregiver. Over-the-counter medicines to reduce pain and inflammation are often the most helpful.Your caregiver may prescribe muscle relaxant drugs.These medicines help dull your pain so you can more quickly return to your normal activities and healthy exercise.  Put ice on the injured area.  Put ice in a plastic bag.  Place a towel between your skin and the bag.  Leave the ice on for 15-20 minutes, 03-04 times a day for the first 2 to 3 days. After that, ice and heat may be alternated to reduce pain and spasms.  Ask your caregiver about trying back exercises and gentle massage. This may be of some benefit.  Avoid feeling anxious or stressed.Stress increases muscle tension and can worsen back pain.It is important to recognize when you are anxious or stressed and learn ways to manage it.Exercise is a great option. SEEK MEDICAL CARE IF:  You have pain that is not relieved with rest or medicine.  You have pain that does not improve in 1 week.  You have new symptoms.  You are generally not feeling well. SEEK   IMMEDIATE MEDICAL CARE IF:   You have pain that radiates from your back into your legs.  You develop new bowel or bladder control problems.  You have unusual weakness or numbness in your arms or legs.  You develop nausea or vomiting.  You develop abdominal pain.  You feel faint. Document Released: 01/25/2005 Document Revised: 07/27/2011 Document Reviewed: 05/29/2013 ExitCare Patient Information 2015 [employer]. This information is not intended to replace advice given to you by your health care provider. Make sure you  discuss any questions you have with your health care provider.  

## 2013-12-05 NOTE — Progress Notes (Signed)
Pre visit review using our clinic review tool, if applicable. No additional management support is needed unless otherwise documented below in the visit note. 

## 2013-12-10 ENCOUNTER — Encounter: Payer: Self-pay | Admitting: Family Medicine

## 2013-12-13 ENCOUNTER — Telehealth: Payer: Self-pay | Admitting: Family Medicine

## 2013-12-13 NOTE — Telephone Encounter (Signed)
Pt called to ask if Dr Elease Hashimoto would send her to water therapy for right side pain.

## 2013-12-13 NOTE — Telephone Encounter (Signed)
Please clarify what she means by water therapy.

## 2013-12-14 NOTE — Telephone Encounter (Signed)
Pt stated water aerobics on her right side. And she is having her spasms.

## 2013-12-16 NOTE — Telephone Encounter (Signed)
Does she need referral?  Please provide more details what she is asking for.  Is she asking for referral to PT??

## 2013-12-17 NOTE — Telephone Encounter (Signed)
Pt is going to check with insurance.

## 2013-12-17 NOTE — Telephone Encounter (Signed)
Im not sure how we have to refer but OK to.

## 2013-12-17 NOTE — Telephone Encounter (Signed)
Yes patient is wanting a referral. Pt stated that PT did not really help and she feels like water aerobics may help her.

## 2014-02-27 ENCOUNTER — Ambulatory Visit (HOSPITAL_COMMUNITY)
Admission: RE | Admit: 2014-02-27 | Discharge: 2014-02-27 | Disposition: A | Discharge status: Home / Self Care | Payer: Federal, State, Local not specified - PPO | Source: Ambulatory Visit | Attending: Chiropractic Medicine | Admitting: Chiropractic Medicine

## 2014-02-27 ENCOUNTER — Other Ambulatory Visit (HOSPITAL_COMMUNITY): Payer: Self-pay | Admitting: Chiropractic Medicine

## 2014-02-27 DIAGNOSIS — M546 Pain in thoracic spine: Secondary | ICD-10-CM | POA: Diagnosis not present

## 2014-02-27 DIAGNOSIS — M545 Low back pain: Secondary | ICD-10-CM | POA: Diagnosis not present

## 2014-03-28 ENCOUNTER — Ambulatory Visit (INDEPENDENT_AMBULATORY_CARE_PROVIDER_SITE_OTHER): Payer: BLUE CROSS/BLUE SHIELD | Admitting: Family Medicine

## 2014-03-28 VITALS — BP 110/80 | HR 79 | Temp 98.2°F | Wt 205.0 lb

## 2014-03-28 DIAGNOSIS — A084 Viral intestinal infection, unspecified: Secondary | ICD-10-CM

## 2014-03-28 MED ORDER — ONDANSETRON 8 MG PO TBDP: 8.0000 mg | ORAL_TABLET | Freq: Three times a day (TID) | ORAL | Status: DC | PRN

## 2014-03-28 NOTE — Progress Notes (Signed)
Pre visit review using our clinic review tool, if applicable. No additional management support is needed unless otherwise documented below in the visit note. 

## 2014-03-28 NOTE — Progress Notes (Signed)
Subjective:    Patient ID: Sandra Hawkins, female    DOB: Sep 22, 1960, 54 y.o.   MRN: [id number]  HPI Acute visit. Patient seen with one-day history of nausea with occasional vomiting. She developed one loose stool just earlier today but no watery stools. Denies abdominal pain. She's had decreased appetite. She had one episode where she had a few flecks of blood with vomiting. No melena. Patient had ultrasound one year ago which showed no gallstones or other acute abnormality. No recent weight changes. No regular nonsteroidal use. No odynophagia  Past Medical History  Diagnosis Date  . Fibroid 04/03/2007  . Irregular menses   . H/O varicella   . History of measles, mumps, or rubella   . Monilial vaginitis 2006  . H/O menorrhagia 2007  . Urge incontinence 2008  . Hepatitis B 11/14/2006  . Pain pelvic   . H/O dysmenorrhea 2009  . History of nausea 2009    With menses   . SVD (spontaneous vaginal delivery)     x 1  . Allergy   . Seasonal allergies   . Arthritis     Rheumatoid - knees  . Spasm of muscle, back     09-08-2013, therepy helped   Past Surgical History  Procedure Laterality Date  . Myomectomy abdominal approach  04/03/2007  . Dilation and curettage of uterus    . Knee surgery    . Laparoscopic hysterectomy  09/20/2011    Procedure: HYSTERECTOMY TOTAL LAPAROSCOPIC;  Surgeon: Delice Lesch, MD;  Location: [facility];  Service: Gynecology;  Laterality: N/A;  . Bladder suspension  09/20/2011    Procedure: TRANSVAGINAL TAPE (TVT) PROCEDURE;  Surgeon: Delice Lesch, MD;  Location: [facility];  Service: Gynecology;  Laterality: N/A;  . Cystoscopy  09/20/2011    Procedure: CYSTOSCOPY;  Surgeon: Delice Lesch, MD;  Location: [facility];  Service: Gynecology;;  . Joint replacement      right knee  . Wisdom tooth extraction    . Transvaginal tape (tvt) removal N/A 11/10/2012    Procedure: TRANSVAGINAL TAPE (TVT) REMOVAL;  Surgeon: Delice Lesch, MD;  Location: [facility];  Service:  Gynecology;  Laterality: N/A;  Removal of Mesh  . Cystoscopy N/A 11/10/2012    Procedure: CYSTOSCOPY;  Surgeon: Delice Lesch, MD;  Location: [facility];  Service: Gynecology;  Laterality: N/A;    reports that she quit smoking about 6 months ago. Her smoking use included Cigarettes. She has a 3 pack-year smoking history. She has never used smokeless tobacco. She reports that she drinks alcohol. She reports that she does not use illicit drugs. family history includes Asthma in her maternal grandmother; Cancer in her father; Diabetes in her maternal grandmother and sister. There is no history of Colon cancer, Esophageal cancer, Pancreatic cancer, Rectal cancer, or Stomach cancer. Allergies  Allergen Reactions  . Dairy Aid [Lactase] Other (See Comments)    bloating  . Shellfish Allergy Hives  . Starch Other (See Comments)    bloating  . Tramadol Itching  . Cortisone Rash      Review of Systems  Constitutional: Positive for appetite change. Negative for fever and chills.  Respiratory: Negative for shortness of breath.   Gastrointestinal: Positive for nausea, vomiting and diarrhea. Negative for abdominal pain.  Neurological: Negative for dizziness.       Objective:   Physical Exam  Constitutional: She appears well-developed.  HENT:  Mouth/Throat: Oropharynx is clear and moist.  Neck: Neck supple.  Cardiovascular: Normal  rate and regular rhythm.   Pulmonary/Chest: Effort normal and breath sounds normal. No respiratory distress. She has no wheezes. She has no rales.  Abdominal: Soft. Bowel sounds are normal. She exhibits no distension and no mass. There is no tenderness. There is no rebound and no guarding.          Assessment & Plan:  Probable viral gastroenteritis. She does not appear clinically dehydrated. Zofran 8 mg 1 every 8 hours as needed for nausea and vomiting. Stay well-hydrated. Follow-up as needed

## 2014-03-28 NOTE — Patient Instructions (Signed)

## 2015-01-13 IMAGING — US US BREAST*L* LIMITED INC AXILLA
1 series · 6 of 6 positions shown · non-contrast
Comparison: Previous exams

CLINICAL DATA: 53-year-old female with a palpable abnormality felt
in the upper left breast by her clinician. The patient cannot feel
this palpable abnormality herself.

EXAM:
DIGITAL DIAGNOSTIC  BILATERAL MAMMOGRAM WITH CAD
ULTRASOUND LEFT BREAST

[Series 1: us breast*left* limited inc axilla · 6 of 6 slices shown]
[im 1/6]
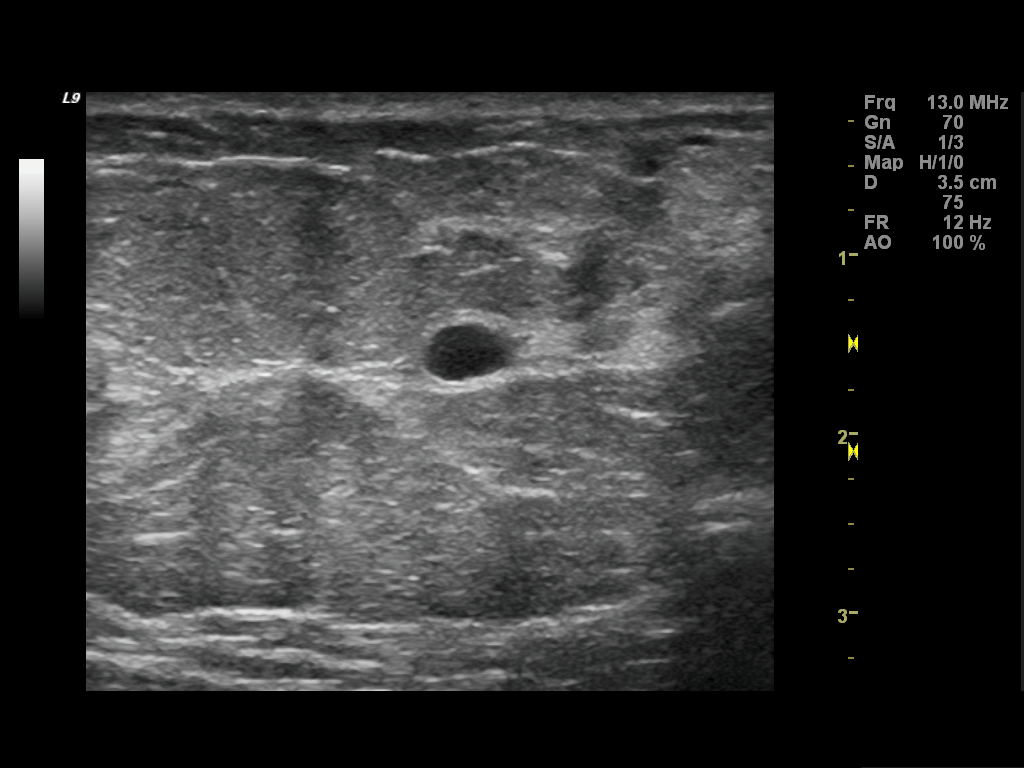
[im 2/6]
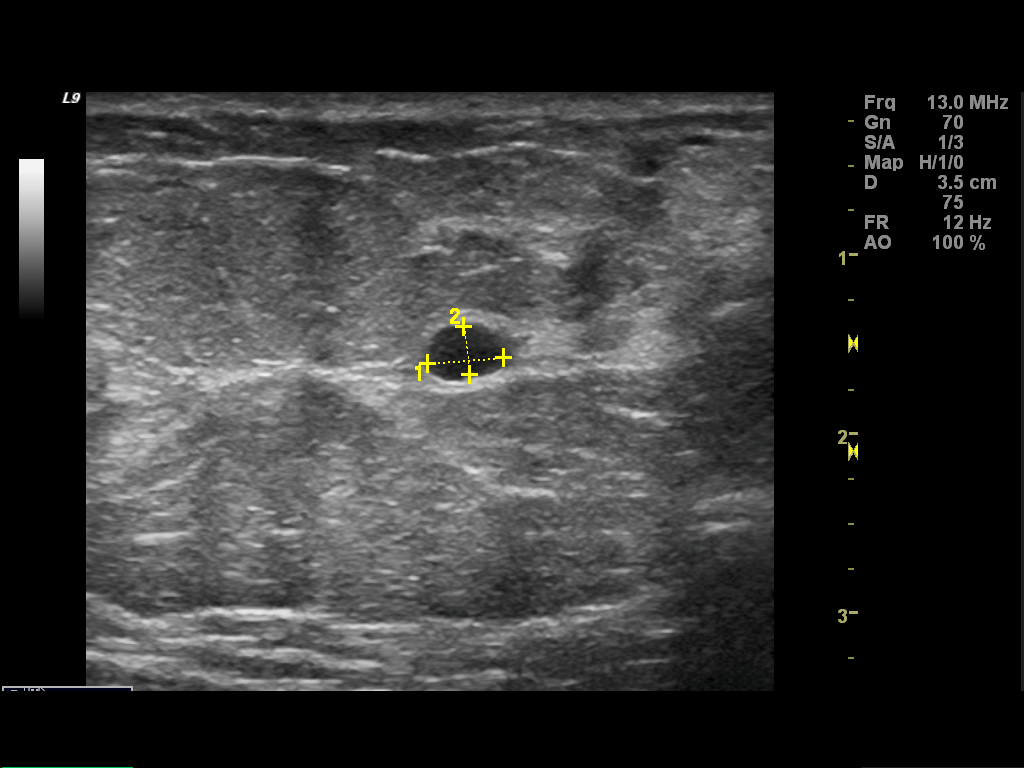
[im 3/6]
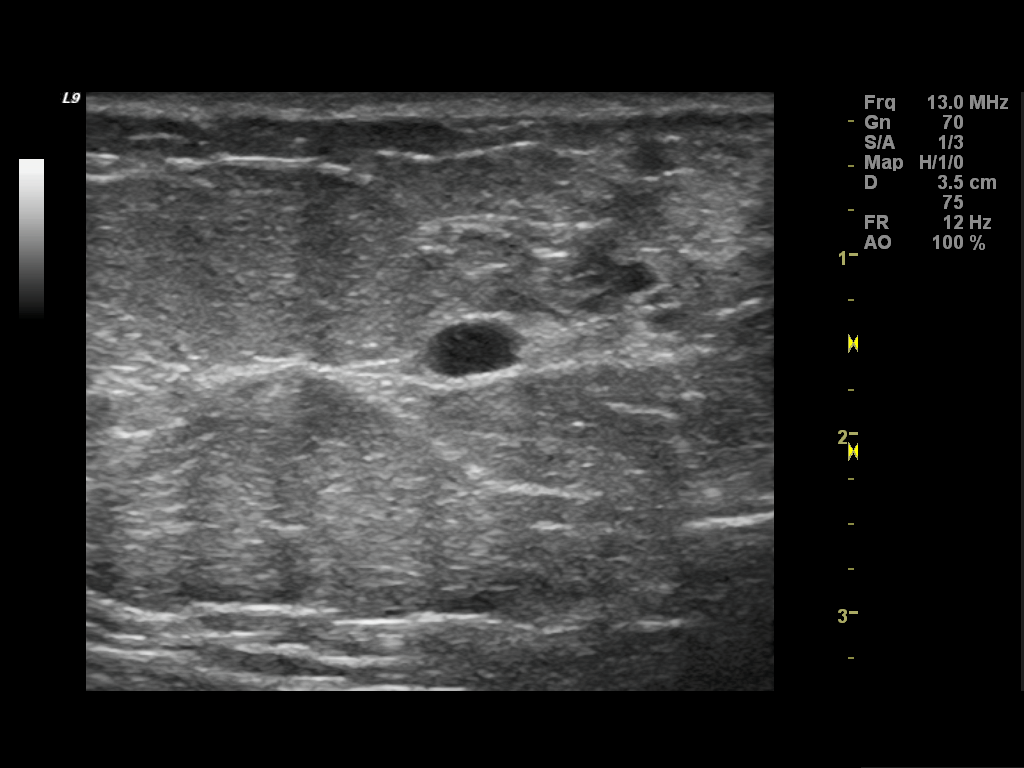
[im 4/6]
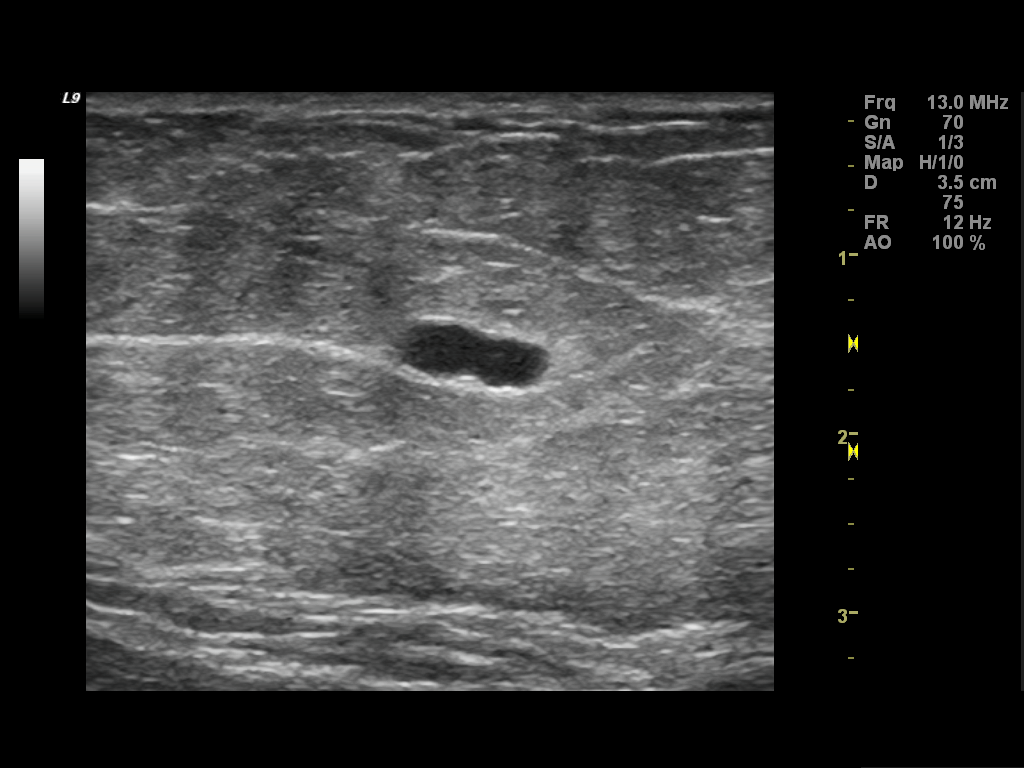
[im 5/6]
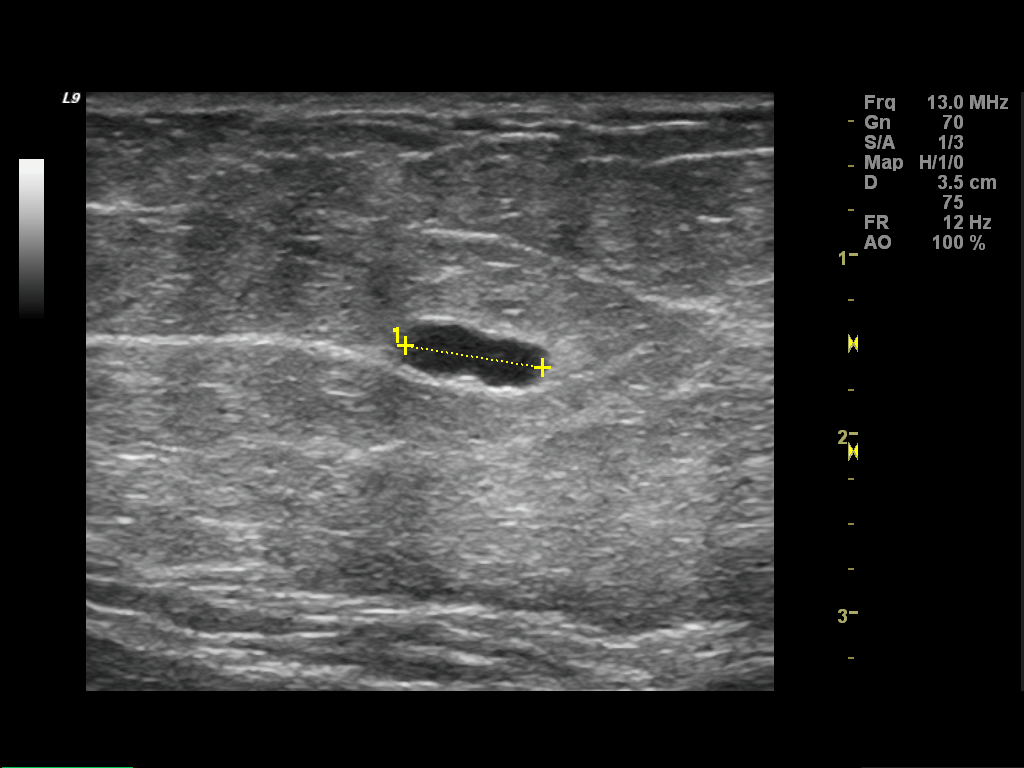
[im 6/6]
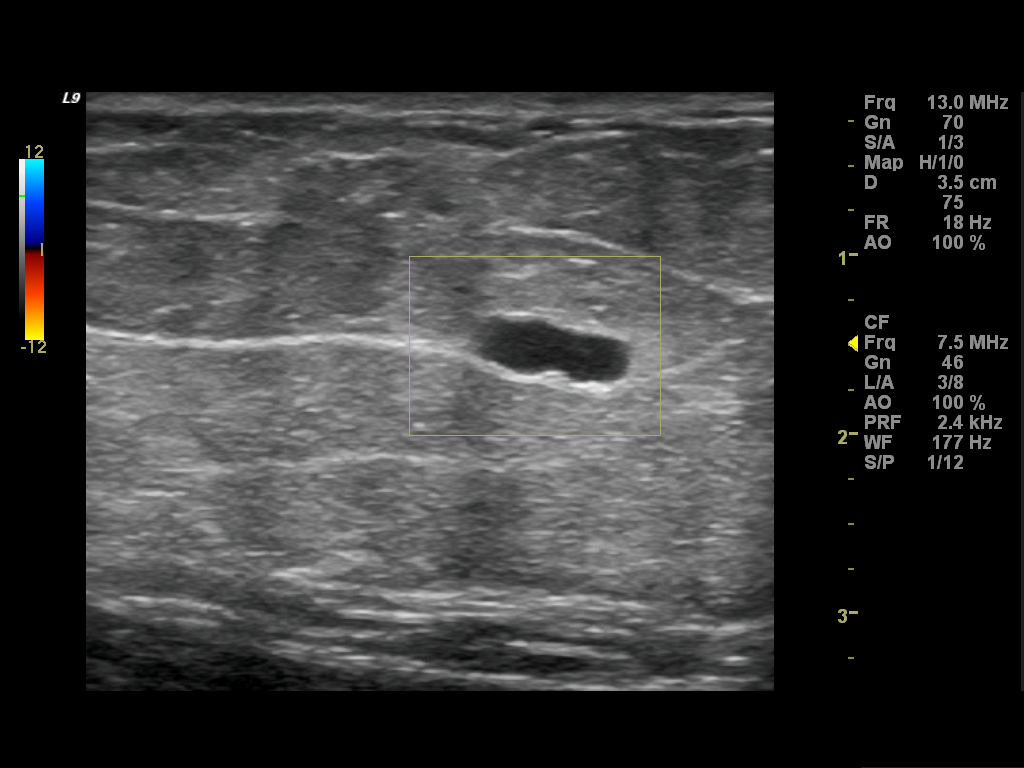

[6 of 6 positions shown; findings below may reference images not displayed]

ACR Breast Density Category c: The breast tissue is heterogeneously
dense, which may obscure small masses.
FINDINGS: No suspicious masses or calcifications are seen in either breast.
There is no mammographic evidence of malignancy in either breast.

Mammographic images were processed with CAD.

Physical examination of the upper left breast does not reveal any
palpable masses. The patient believes the palpable abnormality felt
by the clinician was at the approximate 12 o'clock position.

Targeted ultrasound of the upper left breast was performed
demonstrating several small cysts, with a bilobed cyst at 12 o'clock
4 cm from the nipple 0.4 x 0.3 x 0.8 cm. The entire upper left
breast was scanned with no suspicious findings seen.
IMPRESSION: Left breast cysts which may correspond with the area of palpable
concern. There is no mammographic evidence of malignancy in either
breast.

RECOMMENDATION:
Screening mammogram in one year.(Code:0Q-X-62W)

I have discussed the findings and recommendations with the patient.
Results were also provided in writing at the conclusion of the
visit. If applicable, a reminder letter will be sent to the patient
regarding the next appointment.

BI-RADS CATEGORY  2: Benign.

## 2015-01-13 IMAGING — MG MM DIGITAL DIAGNOSTIC BILAT
4 series · 4 of 4 positions shown · non-contrast
Comparison: Previous exams

CLINICAL DATA: 53-year-old female with a palpable abnormality felt
in the upper left breast by her clinician. The patient cannot feel
this palpable abnormality herself.

EXAM:
DIGITAL DIAGNOSTIC  BILATERAL MAMMOGRAM WITH CAD
ULTRASOUND LEFT BREAST

[R CC]
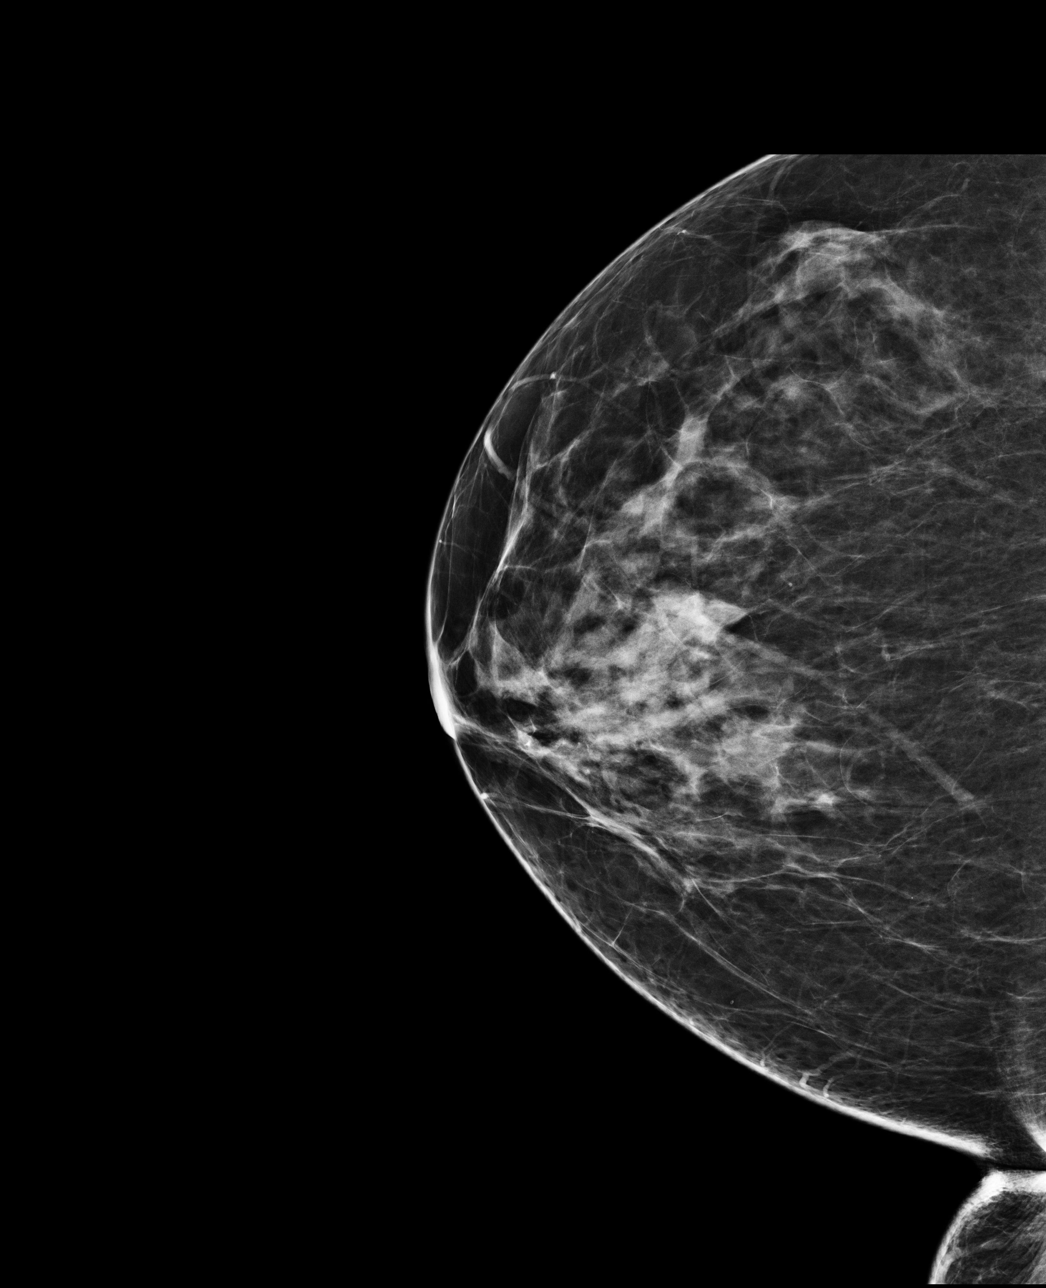

[L CC]
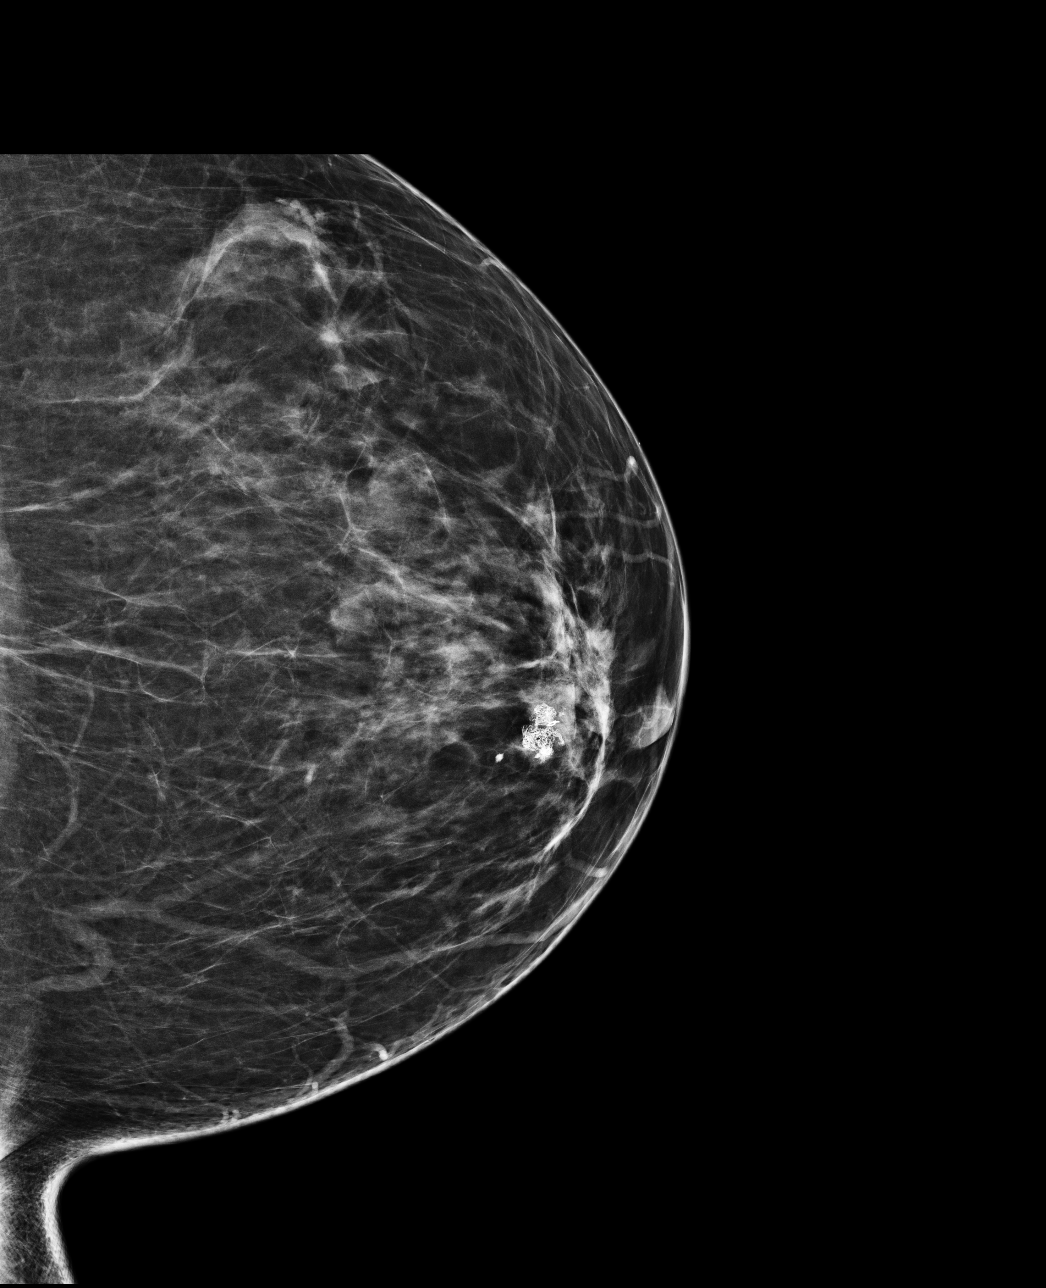

[R MLO]
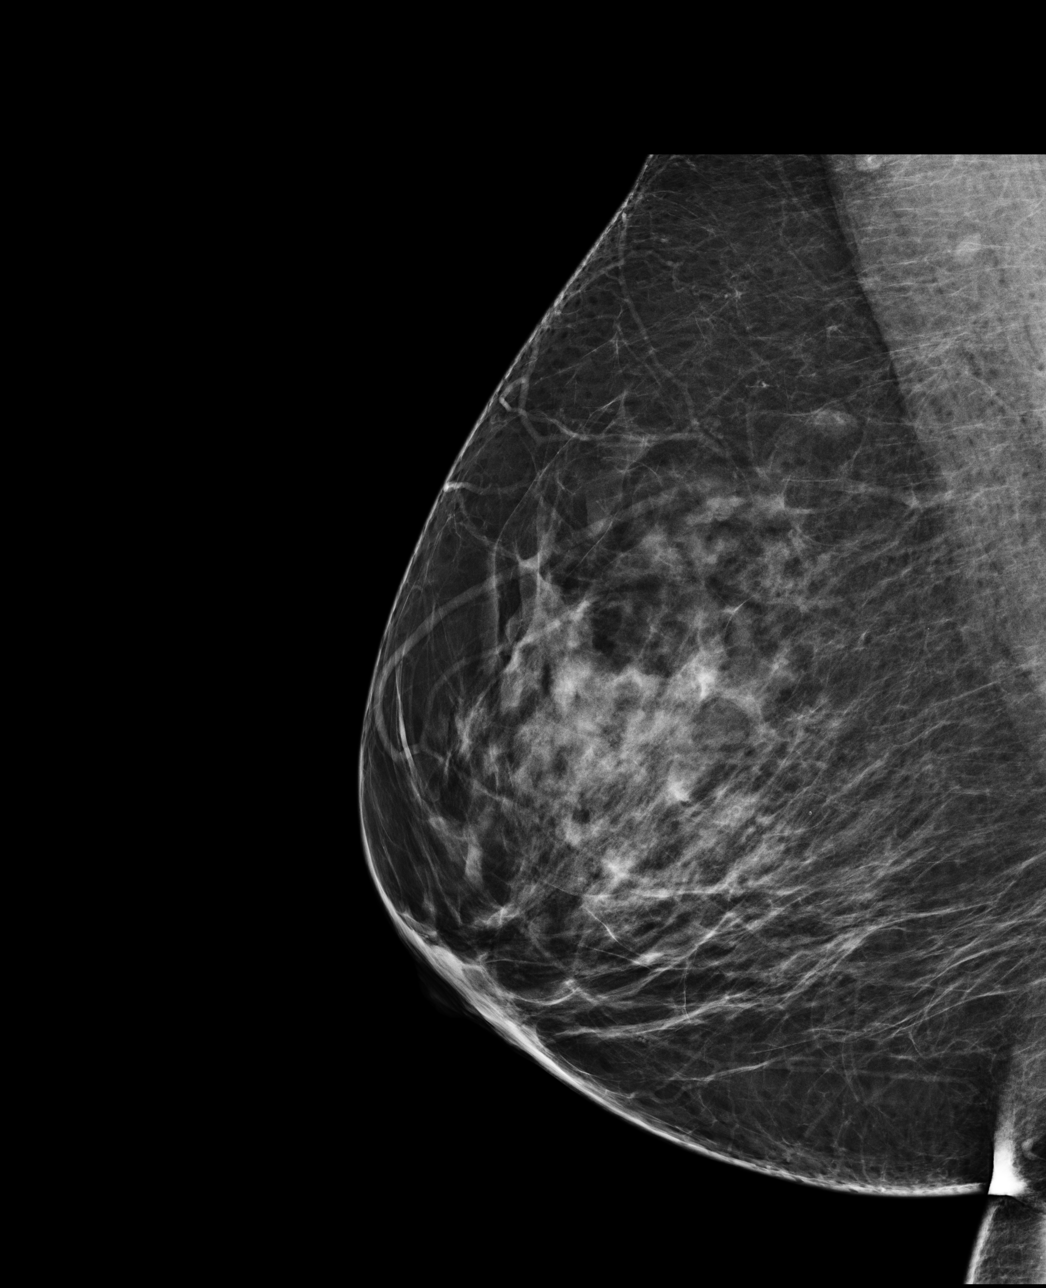

[L MLO]
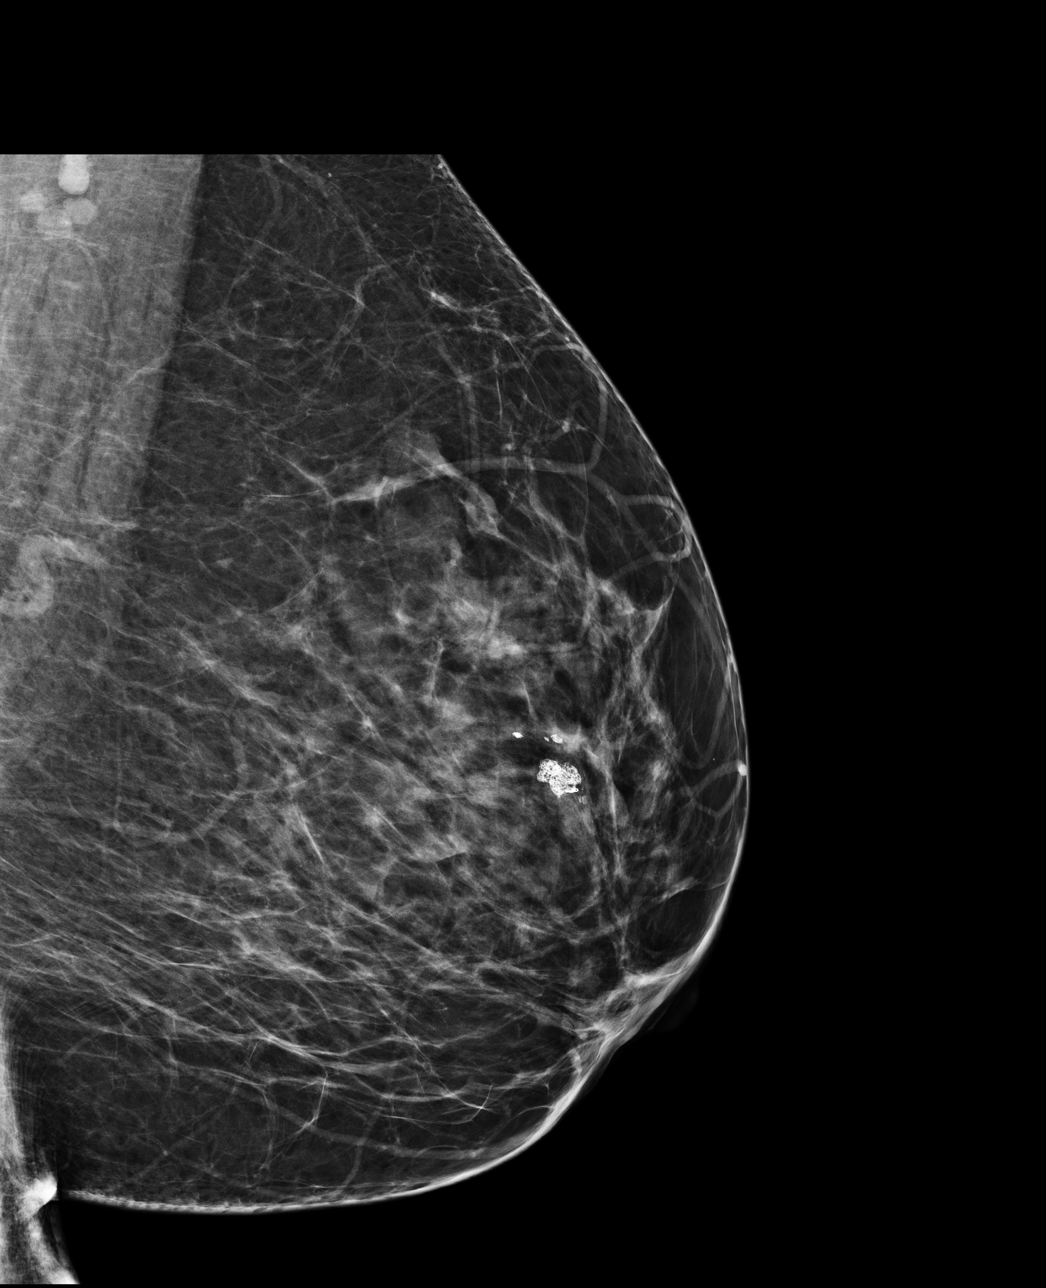

[4 of 4 positions shown; findings below may reference images not displayed]

ACR Breast Density Category c: The breast tissue is heterogeneously
dense, which may obscure small masses.
FINDINGS: No suspicious masses or calcifications are seen in either breast.
There is no mammographic evidence of malignancy in either breast.

Mammographic images were processed with CAD.

Physical examination of the upper left breast does not reveal any
palpable masses. The patient believes the palpable abnormality felt
by the clinician was at the approximate 12 o'clock position.

Targeted ultrasound of the upper left breast was performed
demonstrating several small cysts, with a bilobed cyst at 12 o'clock
4 cm from the nipple 0.4 x 0.3 x 0.8 cm. The entire upper left
breast was scanned with no suspicious findings seen.
IMPRESSION: Left breast cysts which may correspond with the area of palpable
concern. There is no mammographic evidence of malignancy in either
breast.

RECOMMENDATION:
Screening mammogram in one year.(Code:0Q-X-62W)

I have discussed the findings and recommendations with the patient.
Results were also provided in writing at the conclusion of the
visit. If applicable, a reminder letter will be sent to the patient
regarding the next appointment.

BI-RADS CATEGORY  2: Benign.

## 2015-03-27 ENCOUNTER — Encounter: Payer: Self-pay | Admitting: Family Medicine

## 2015-03-27 ENCOUNTER — Ambulatory Visit (INDEPENDENT_AMBULATORY_CARE_PROVIDER_SITE_OTHER): Payer: Federal, State, Local not specified - PPO | Admitting: Family Medicine

## 2015-03-27 VITALS — BP 110/90 | HR 95 | Temp 97.9°F | Ht 67.5 in | Wt 210.4 lb

## 2015-03-27 DIAGNOSIS — J069 Acute upper respiratory infection, unspecified: Secondary | ICD-10-CM

## 2015-03-27 DIAGNOSIS — B9789 Other viral agents as the cause of diseases classified elsewhere: Principal | ICD-10-CM

## 2015-03-27 NOTE — Progress Notes (Signed)
Pre visit review using our clinic review tool, if applicable. No additional management support is needed unless otherwise documented below in the visit note. 

## 2015-03-27 NOTE — Patient Instructions (Signed)

## 2015-03-27 NOTE — Progress Notes (Signed)
Subjective:    Patient ID: Sandra Hawkins, female    DOB: 03-03-1960, 55 y.o.   MRN: [id number]  HPI  acute visit  Patient seen with onset about 5 days ago of cough, body aches, nasal congestion  Increased malaise. Slightly diminished appetite. Denies sore throat.  No nausea, vomiting, or diarrhea.  She missed work past 3 days and went in earlier today and was reportedly sent home. She states she actually feels slightly better than yesterday. No reported fever.  Cough has been productive -especially early in the mornings. Reasonably well controlled with over-the-counter cough medication  Past Medical History  Diagnosis Date  . Fibroid 04/03/2007  . Irregular menses   . H/O varicella   . History of measles, mumps, or rubella   . Monilial vaginitis 2006  . H/O menorrhagia 2007  . Urge incontinence 2008  . Hepatitis B 11/14/2006  . Pain pelvic   . H/O dysmenorrhea 2009  . History of nausea 2009    With menses   . SVD (spontaneous vaginal delivery)     x 1  . Allergy   . Seasonal allergies   . Arthritis     Rheumatoid - knees  . Spasm of muscle, back     09-08-2013, therepy helped   Past Surgical History  Procedure Laterality Date  . Myomectomy abdominal approach  04/03/2007  . Dilation and curettage of uterus    . Knee surgery    . Laparoscopic hysterectomy  09/20/2011    Procedure: HYSTERECTOMY TOTAL LAPAROSCOPIC;  Surgeon: Delice Lesch, MD;  Location: [facility];  Service: Gynecology;  Laterality: N/A;  . Bladder suspension  09/20/2011    Procedure: TRANSVAGINAL TAPE (TVT) PROCEDURE;  Surgeon: Delice Lesch, MD;  Location: [facility];  Service: Gynecology;  Laterality: N/A;  . Cystoscopy  09/20/2011    Procedure: CYSTOSCOPY;  Surgeon: Delice Lesch, MD;  Location: [facility];  Service: Gynecology;;  . Joint replacement      right knee  . Wisdom tooth extraction    . Transvaginal tape (tvt) removal N/A 11/10/2012    Procedure: TRANSVAGINAL TAPE (TVT) REMOVAL;  Surgeon:  Delice Lesch, MD;  Location: [facility];  Service: Gynecology;  Laterality: N/A;  Removal of Mesh  . Cystoscopy N/A 11/10/2012    Procedure: CYSTOSCOPY;  Surgeon: Delice Lesch, MD;  Location: [facility];  Service: Gynecology;  Laterality: N/A;    reports that she quit smoking about 18 months ago. Her smoking use included Cigarettes. She has a 3 pack-year smoking history. She has never used smokeless tobacco. She reports that she drinks alcohol. She reports that she does not use illicit drugs. family history includes Asthma in her maternal grandmother; Cancer in her father; Diabetes in her maternal grandmother and sister. There is no history of Colon cancer, Esophageal cancer, Pancreatic cancer, Rectal cancer, or Stomach cancer. Allergies  Allergen Reactions  . Dairy Aid [Lactase] Other (See Comments)    bloating  . Shellfish Allergy Hives  . Starch Other (See Comments)    bloating  . Tramadol Itching  . Cortisone Rash      Review of Systems  Constitutional: Positive for fatigue. Negative for fever and chills.  HENT: Positive for congestion. Negative for sore throat.   Respiratory: Positive for cough. Negative for shortness of breath and wheezing.   Cardiovascular: Negative for chest pain.  Musculoskeletal: Positive for myalgias.       Objective:   Physical Exam  Constitutional: She appears well-developed  and well-nourished. No distress.  HENT:  Right Ear: External ear normal.  Left Ear: External ear normal.  Mouth/Throat: Oropharynx is clear and moist.  Neck: Neck supple.  Cardiovascular: Normal rate and regular rhythm.   Pulmonary/Chest: Effort normal and breath sounds normal. No respiratory distress. She has no wheezes. She has no rales.  Lymphadenopathy:    She has no cervical adenopathy.          Assessment & Plan:   Viral URI with cough. Non-focal exam. Continue Mucinex. Work note written for 03/24/2015 through  03/31/2015.   Follow-up for any fever or worsening  symptoms

## 2015-12-04 DIAGNOSIS — Z1231 Encounter for screening mammogram for malignant neoplasm of breast: Secondary | ICD-10-CM | POA: Diagnosis not present

## 2015-12-04 LAB — HM MAMMOGRAPHY: HM Mammogram: ABNORMAL — AB (ref 0–4)

## 2015-12-26 ENCOUNTER — Encounter: Payer: Self-pay | Admitting: Family Medicine

## 2016-01-26 ENCOUNTER — Other Ambulatory Visit: Payer: Self-pay | Admitting: Obstetrics and Gynecology

## 2016-01-26 DIAGNOSIS — R928 Other abnormal and inconclusive findings on diagnostic imaging of breast: Secondary | ICD-10-CM

## 2016-01-30 ENCOUNTER — Other Ambulatory Visit: Payer: Federal, State, Local not specified - PPO

## 2016-02-12 ENCOUNTER — Ambulatory Visit
Admission: RE | Admit: 2016-02-12 | Discharge: 2016-02-12 | Disposition: A | Discharge status: Home / Self Care | Payer: Federal, State, Local not specified - PPO | Source: Ambulatory Visit | Attending: Obstetrics and Gynecology | Admitting: Obstetrics and Gynecology

## 2016-02-12 ENCOUNTER — Other Ambulatory Visit: Payer: Federal, State, Local not specified - PPO

## 2016-02-12 DIAGNOSIS — R928 Other abnormal and inconclusive findings on diagnostic imaging of breast: Secondary | ICD-10-CM

## 2016-02-12 DIAGNOSIS — N6489 Other specified disorders of breast: Secondary | ICD-10-CM | POA: Diagnosis not present

## 2016-04-19 ENCOUNTER — Other Ambulatory Visit: Payer: Self-pay | Admitting: Family

## 2016-05-03 DIAGNOSIS — R635 Abnormal weight gain: Secondary | ICD-10-CM | POA: Diagnosis not present

## 2016-05-03 DIAGNOSIS — Z01419 Encounter for gynecological examination (general) (routine) without abnormal findings: Secondary | ICD-10-CM | POA: Diagnosis not present

## 2016-05-03 DIAGNOSIS — Z6832 Body mass index (BMI) 32.0-32.9, adult: Secondary | ICD-10-CM | POA: Diagnosis not present

## 2016-05-03 DIAGNOSIS — R922 Inconclusive mammogram: Secondary | ICD-10-CM | POA: Diagnosis not present

## 2016-07-03 ENCOUNTER — Encounter (HOSPITAL_COMMUNITY): Payer: Self-pay

## 2016-07-03 ENCOUNTER — Emergency Department (HOSPITAL_COMMUNITY): Payer: Federal, State, Local not specified - PPO

## 2016-07-03 ENCOUNTER — Emergency Department (HOSPITAL_COMMUNITY)
Admission: EM | Admit: 2016-07-03 | Discharge: 2016-07-03 | Disposition: A | Discharge status: Home / Self Care | Payer: Federal, State, Local not specified - PPO | Attending: Emergency Medicine | Admitting: Emergency Medicine

## 2016-07-03 DIAGNOSIS — R0981 Nasal congestion: Secondary | ICD-10-CM | POA: Diagnosis not present

## 2016-07-03 DIAGNOSIS — R059 Cough, unspecified: Secondary | ICD-10-CM

## 2016-07-03 DIAGNOSIS — R05 Cough: Secondary | ICD-10-CM | POA: Insufficient documentation

## 2016-07-03 DIAGNOSIS — Z87891 Personal history of nicotine dependence: Secondary | ICD-10-CM | POA: Diagnosis not present

## 2016-07-03 MED ORDER — BENZONATATE 100 MG PO CAPS: 100.0000 mg | ORAL_CAPSULE | Freq: Three times a day (TID) | ORAL | 0 refills | Status: DC

## 2016-07-03 MED ORDER — ALBUTEROL SULFATE HFA 108 (90 BASE) MCG/ACT IN AERS: 1.0000 | INHALATION_SPRAY | Freq: Four times a day (QID) | RESPIRATORY_TRACT | 0 refills | Status: DC | PRN

## 2016-07-03 NOTE — ED Provider Notes (Signed)
[facility] DEPT Provider Note   CSN: [id number] Arrival date & time: 07/03/16  0442     History   Chief Complaint Chief Complaint  Patient presents with  . Cough  . Nasal Congestion    HPI Sandra Hawkins is a 56 y.o. female.  The history is provided by the patient and medical records.    56 year old female with history of seasonal allergies, rheumatoid arthritis, hep B, irregular menses status post hysterectomy, presenting to the ED with cough and nasal congestion. Patient reports is about 4 days ago she was exposed to someone at work that had bronchitis with a bad cough. States the next day she began feeling sick with nasal congestion, productive cough with white sputum, fatigue, and body aches.  States she has some rib pain when she coughs heavily, but otherwise no chest pain. No shortness of breath. She's not had any fever or chills. She has no known history of asthma or other pulmonary issues. She has been using cough drops to help suppress her cough, but still wakes multiple times during the night has had some difficulty sleeping.  Past Medical History:  Diagnosis Date  . Allergy   . Arthritis    Rheumatoid - knees  . Fibroid 04/03/2007  . H/O dysmenorrhea 2009  . H/O menorrhagia 2007  . H/O varicella   . Hepatitis B 11/14/2006  . History of measles, mumps, or rubella   . History of nausea 2009   With menses   . Irregular menses   . Monilial vaginitis 2006  . Pain pelvic   . Seasonal allergies   . Spasm of muscle, back    09-08-2013, therepy helped  . SVD (spontaneous vaginal delivery)    x 1  . Urge incontinence 2008    Patient Active Problem List   Diagnosis Date Noted  . OSTEOARTHRITIS 02/26/2010  . HEPATITIS B, ACUTE 12/14/2006  . FIBROIDS, UTERUS - s/p TLH and TVT on 09/20/11 12/14/2006  . DEGENERATIVE JOINT DISEASE, KNEE 12/14/2006    Past Surgical History:  Procedure Laterality Date  . BLADDER SUSPENSION  09/20/2011   Procedure: TRANSVAGINAL  TAPE (TVT) PROCEDURE;  Surgeon: Delice Lesch, MD;  Location: [facility];  Service: Gynecology;  Laterality: N/A;  . CYSTOSCOPY  09/20/2011   Procedure: CYSTOSCOPY;  Surgeon: Delice Lesch, MD;  Location: [facility];  Service: Gynecology;;  . Consuela Mimes N/A 11/10/2012   Procedure: CYSTOSCOPY;  Surgeon: Delice Lesch, MD;  Location: [facility];  Service: Gynecology;  Laterality: N/A;  . DILATION AND CURETTAGE OF UTERUS    . JOINT REPLACEMENT     right knee  . KNEE SURGERY    . LAPAROSCOPIC HYSTERECTOMY  09/20/2011   Procedure: HYSTERECTOMY TOTAL LAPAROSCOPIC;  Surgeon: Delice Lesch, MD;  Location: [facility];  Service: Gynecology;  Laterality: N/A;  . MYOMECTOMY ABDOMINAL APPROACH  04/03/2007  . TRANSVAGINAL TAPE (TVT) REMOVAL N/A 11/10/2012   Procedure: TRANSVAGINAL TAPE (TVT) REMOVAL;  Surgeon: Delice Lesch, MD;  Location: [facility];  Service: Gynecology;  Laterality: N/A;  Removal of Mesh  . WISDOM TOOTH EXTRACTION      OB History    Gravida Para Term Preterm AB Living   3 1     2 1    SAB TAB Ectopic Multiple Live Births   2       1       Home Medications    Prior to Admission medications   Medication Sig Start Date End Date Taking? Authorizing  Provider  Naphazoline HCl (CLEAR EYES OP) Place 1 drop into both eyes daily as needed (for dry eyes).   Yes [provider]  ondansetron (ZOFRAN ODT) 8 MG disintegrating tablet Take 1 tablet (8 mg total) by mouth every 8 (eight) hours as needed for nausea or vomiting. 03/28/14  Yes Burchette, Alinda Sierras, MD    Family History Family History  Problem Relation Age of Onset  . Cancer Father        Lung  . Asthma Maternal Grandmother   . Diabetes Maternal Grandmother   . Diabetes Sister   . Colon cancer Neg Hx   . Esophageal cancer Neg Hx   . Pancreatic cancer Neg Hx   . Rectal cancer Neg Hx   . Stomach cancer Neg Hx     Social History Social History  Substance Use Topics  . Smoking status: Former Smoker    Packs/day: 1.00     Years: 3.00    Types: Cigarettes    Quit date: 09/24/2013  . Smokeless tobacco: Never Used  . Alcohol use Yes     Comment: rsre     Allergies   Dairy aid [lactase]; Shellfish allergy; Starch; Tramadol; and Cortisone   Review of Systems Review of Systems  HENT: Positive for congestion.   Respiratory: Positive for cough.   All other systems reviewed and are negative.    Physical Exam Updated Vital Signs BP 112/76   Pulse 65   Temp 98.1 F (36.7 C) (Oral)   Resp 16   LMP 09/13/2011   SpO2 99%   Physical Exam  Constitutional: She is oriented to person, place, and time. She appears well-developed and well-nourished.  HENT:  Head: Normocephalic and atraumatic.  Right Ear: Tympanic membrane and ear canal normal.  Left Ear: Tympanic membrane and ear canal normal.  Nose: Mucosal edema present.  Mouth/Throat: Uvula is midline, oropharynx is clear and moist and mucous membranes are normal.  Eyes: Conjunctivae and EOM are normal. Pupils are equal, round, and reactive to light.  Neck: Normal range of motion.  Cardiovascular: Normal rate, regular rhythm and normal heart sounds.   Pulmonary/Chest: Effort normal and breath sounds normal. No respiratory distress. She has no wheezes.  Dry cough on exam, seems triggered by deep breathing, no wheezes or rhonchi, no distress, able to speak in sentences without issue  Abdominal: Soft. Bowel sounds are normal. There is no tenderness. There is no rebound.  Musculoskeletal: Normal range of motion.  Neurological: She is alert and oriented to person, place, and time.  Skin: Skin is warm and dry.  Psychiatric: She has a normal mood and affect.  Nursing note and vitals reviewed.    ED Treatments / Results  Labs (all labs ordered are listed, but only abnormal results are displayed) Labs Reviewed - No data to display  EKG  EKG Interpretation None       Radiology Dg Chest 2 View  Result Date: 07/03/2016 CLINICAL DATA:  Productive  cough. EXAM: CHEST  2 VIEW COMPARISON:  Radiographs of December 02, 2013. FINDINGS: The heart size and mediastinal contours are within normal limits. Both lungs are clear. No pneumothorax or pleural effusion is noted. The visualized skeletal structures are unremarkable. IMPRESSION: No active cardiopulmonary disease. Electronically Signed   By: Marijo Conception, M.D.   On: 07/03/2016 07:54    Procedures Procedures (including critical care time)  Medications Ordered in ED Medications - No data to display   Initial Impression / Assessment and Plan /  ED Course  I have reviewed the triage vital signs and the nursing notes.  Pertinent labs & imaging results that were available during my care of the patient were reviewed by me and considered in my medical decision making (see chart for details).  56 year old female here with cough and nasal congestion for 3 days. Reports sick exposures at work with similar symptoms. She is afebrile and nontoxic. Exam is overall benign aside from a dry cough and some nasal congestion. She has no wheezes or rhonchi. Vital signs are stable. Chest x-ray was obtained which is negative. Feel this is likely viral. We discharged home with cough medication. Patient requested inhaler just in case which was provided for her.  Recommended to follow-up with PCP.  Discussed plan with patient, she acknowledged understanding and agreed with plan of care.  Return precautions given for new or worsening symptoms.  Final Clinical Impressions(s) / ED Diagnoses   Final diagnoses:  Cough  Nasal congestion    New Prescriptions New Prescriptions   ALBUTEROL (PROVENTIL HFA;VENTOLIN HFA) 108 (90 BASE) MCG/ACT INHALER    Inhale 1-2 puffs into the lungs every 6 (six) hours as needed for wheezing.   BENZONATATE (TESSALON) 100 MG CAPSULE    Take 1 capsule (100 mg total) by mouth every 8 (eight) hours.     Larene Pickett, PA-C 07/03/16 [place], April, MD 07/03/16 2321

## 2016-07-03 NOTE — ED Triage Notes (Signed)
Pt states she started having cough with congestion abut 3 days ago; pt states she has productive cough; pt denies SOB and able to speak full complete sentences; pt a&x4 on arrival; pt c/o generalized body aches of 5/10

## 2016-07-03 NOTE — Discharge Instructions (Signed)
Use the cough medicine when needed. Follow-up with your primary care doctor if you have ongoing issues. Return to the ED for new or worsening symptoms.

## 2016-11-25 ENCOUNTER — Other Ambulatory Visit: Payer: Self-pay | Admitting: Obstetrics and Gynecology

## 2016-11-25 DIAGNOSIS — N6320 Unspecified lump in the left breast, unspecified quadrant: Secondary | ICD-10-CM

## 2016-12-01 ENCOUNTER — Other Ambulatory Visit: Payer: Federal, State, Local not specified - PPO

## 2016-12-07 ENCOUNTER — Other Ambulatory Visit: Payer: Federal, State, Local not specified - PPO

## 2016-12-09 DIAGNOSIS — A084 Viral intestinal infection, unspecified: Secondary | ICD-10-CM | POA: Diagnosis not present

## 2016-12-09 DIAGNOSIS — J069 Acute upper respiratory infection, unspecified: Secondary | ICD-10-CM | POA: Diagnosis not present

## 2016-12-09 DIAGNOSIS — R112 Nausea with vomiting, unspecified: Secondary | ICD-10-CM | POA: Diagnosis not present

## 2016-12-09 DIAGNOSIS — R1084 Generalized abdominal pain: Secondary | ICD-10-CM | POA: Diagnosis not present

## 2016-12-10 ENCOUNTER — Other Ambulatory Visit: Payer: Federal, State, Local not specified - PPO

## 2016-12-14 ENCOUNTER — Ambulatory Visit
Admission: RE | Admit: 2016-12-14 | Discharge: 2016-12-14 | Disposition: A | Discharge status: Home / Self Care | Payer: Federal, State, Local not specified - PPO | Source: Ambulatory Visit | Attending: Obstetrics and Gynecology | Admitting: Obstetrics and Gynecology

## 2016-12-14 ENCOUNTER — Ambulatory Visit: Payer: Federal, State, Local not specified - PPO

## 2016-12-14 DIAGNOSIS — N6320 Unspecified lump in the left breast, unspecified quadrant: Secondary | ICD-10-CM

## 2016-12-14 DIAGNOSIS — R928 Other abnormal and inconclusive findings on diagnostic imaging of breast: Secondary | ICD-10-CM | POA: Diagnosis not present

## 2017-04-13 ENCOUNTER — Encounter: Payer: Self-pay | Admitting: Family Medicine

## 2017-04-13 ENCOUNTER — Telehealth: Payer: Self-pay | Admitting: Family Medicine

## 2017-04-13 ENCOUNTER — Ambulatory Visit: Payer: Federal, State, Local not specified - PPO | Admitting: Family Medicine

## 2017-04-13 VITALS — BP 124/80 | HR 71 | Temp 98.1°F | Wt 194.4 lb

## 2017-04-13 DIAGNOSIS — L723 Sebaceous cyst: Secondary | ICD-10-CM

## 2017-04-13 DIAGNOSIS — L509 Urticaria, unspecified: Secondary | ICD-10-CM | POA: Diagnosis not present

## 2017-04-13 MED ORDER — PREDNISONE 10 MG PO TABS: ORAL_TABLET | ORAL | 0 refills | Status: DC

## 2017-04-13 MED ORDER — PREDNISONE 10 MG PO TABS
ORAL_TABLET | ORAL | 0 refills | Status: DC
Start: 2017-04-13 — End: 2017-04-13

## 2017-04-13 NOTE — Patient Instructions (Signed)
Hives Hives (urticaria) are itchy, red, swollen areas on your skin. Hives can appear on any part of your body and can vary in size. They can be as small as the tip of a pen or much larger. Hives often fade within 24 hours (acute hives). In other cases, new hives appear after old ones fade. This cycle can continue for several days or weeks (chronic hives). Hives result from your body's reaction to an irritant or to something that you are allergic to (trigger). When you are exposed to a trigger, your body releases a chemical (histamine) that causes redness, itching, and swelling. You can get hives immediately after being exposed to a trigger or hours later. Hives do not spread from person to person (are not contagious). Your hives may get worse with scratching, exercise, and emotional stress. What are the causes? Causes of this condition include:  Allergies to certain foods or ingredients.  Insect bites or stings.  Exposure to pollen or pet dander.  Contact with latex or chemicals.  Spending time in sunlight, heat, or cold (exposure).  Exercise.  Stress.  You can also get hives from some medical conditions and treatments. These include:  Viruses, including the common cold.  Bacterial infections, such as urinary tract infections and strep throat.  Disorders such as vasculitis, lupus, or thyroid disease.  Certain medications.  Allergy shots.  Blood transfusions.  Sometimes, the cause of hives is not known (idiopathic hives). What increases the risk? This condition is more likely to develop in:  Women.  People who have food allergies, especially to citrus fruits, milk, eggs, peanuts, tree nuts, or shellfish.  People who are allergic to: ? Medicines. ? Latex. ? Insects. ? Animals. ? Pollen.  People who have certain medical conditions, includinglupus or thyroid disease.  What are the signs or symptoms? The main symptom of this condition is raised, itchyred or white  bumps or patches on your skin. These areas may:  Become large and swollen (welts).  Change in shape and location, quickly and repeatedly.  Be separate hives or connect over a large area of skin.  Sting or become painful.  Turn white when pressed in the center (blanch).  In severe cases, yourhands, feet, and face may also become swollen. This may occur if hives develop deeper in your skin. How is this diagnosed? This condition is diagnosed based on your symptoms, medical history, and physical exam. Your skin, urine, or blood may be tested to find out what is causing your hives and to rule out other health issues. Your health care provider may also remove a small sample of skin from the affected area and examine it under a microscope (biopsy). How is this treated? Treatment depends on the severity of your condition. Your health care provider may recommend using cool, wet cloths (cool compresses) or taking cool showers to relieve itching. Hives are sometimes treated with medicines, including:  Antihistamines.  Corticosteroids.  Antibiotics.  An injectable medicine (omalizumab). Your health care provider may prescribe this if you have chronic idiopathic hives and you continue to have symptoms even after treatment with antihistamines.  Severe cases may require an emergency injection of adrenaline (epinephrine) to prevent a life-threatening allergic reaction (anaphylaxis). Follow these instructions at home: Medicines  Take or apply over-the-counter and prescription medicines only as told by your health care provider.  If you were prescribed an antibiotic medicine, use it as told by your health care provider. Do not stop taking the antibiotic even if you start  to feel better. Skin Care  Apply cool compresses to the affected areas.  Do not scratch or rub your skin. General instructions  Do not take hot showers or baths. This can make itching worse.  Do not wear tight-fitting  clothing.  Use sunscreen and wear protective clothing when you are outside.  Avoid any substances that cause your hives. Keep a journal to help you track what causes your hives. Write down: ? What medicines you take. ? What you eat and drink. ? What products you use on your skin.  Keep all follow-up visits as told by your health care provider. This is important. Contact a health care provider if:  Your symptoms are not controlled with medicine.  Your joints are painful or swollen. Get help right away if:  You have a fever.  You have pain in your abdomen.  Your tongue or lips are swollen.  Your eyelids are swollen.  Your chest or throat feels tight.  You have trouble breathing or swallowing. These symptoms may represent a serious problem that is an emergency. Do not wait to see if the symptoms will go away. Get medical help right away. Call your local emergency services (911 in the [place] Do not drive yourself to the hospital. This information is not intended to replace advice given to you by your health care provider. Make sure you discuss any questions you have with your health care provider. Document Released: 01/25/2005 Document Revised: 06/25/2015 Document Reviewed: 11/13/2014 Elsevier Interactive Patient Education  2018 [employer].  Consider OTC anti-histamine such as benadryl or Zyrtec.

## 2017-04-13 NOTE — Progress Notes (Signed)
Subjective:     Patient ID: Sandra Hawkins, female   DOB: 14-Dec-1960, 57 y.o.   MRN: [id number]  HPI Patient seen for the following issues  Pruritic raised rash on her right lower neck and upper back region. Noted a few days ago. She's tried hydrogen peroxide and alcohol topically without much relief. Pruritic but nonpainful. No fevers or chills. Exacerbated by heat. No alleviating factors. Has not tried any antihistamines. Denies any generalized hives.  Second issue is small lump right axillary region. Nontender. Just noted a few days ago. She was concerned because her daughter was diagnosed with breast cancer age 54 in the past year. Patient had mammogram in November and is getting every 6 month follow-ups. She's not noted any breast masses.  No adenopathy.  Past Medical History:  Diagnosis Date  . Allergy   . Arthritis    Rheumatoid - knees  . Fibroid 04/03/2007  . H/O dysmenorrhea 2009  . H/O menorrhagia 2007  . H/O varicella   . Hepatitis B 11/14/2006  . History of measles, mumps, or rubella   . History of nausea 2009   With menses   . Irregular menses   . Monilial vaginitis 2006  . Pain pelvic   . Seasonal allergies   . Spasm of muscle, back    09-08-2013, therepy helped  . SVD (spontaneous vaginal delivery)    x 1  . Urge incontinence 2008   Past Surgical History:  Procedure Laterality Date  . BLADDER SUSPENSION  09/20/2011   Procedure: TRANSVAGINAL TAPE (TVT) PROCEDURE;  Surgeon: Delice Lesch, MD;  Location: [facility];  Service: Gynecology;  Laterality: N/A;  . BREAST BIOPSY Left   . CYSTOSCOPY  09/20/2011   Procedure: CYSTOSCOPY;  Surgeon: Delice Lesch, MD;  Location: [facility];  Service: Gynecology;;  . Consuela Mimes N/A 11/10/2012   Procedure: CYSTOSCOPY;  Surgeon: Delice Lesch, MD;  Location: [facility];  Service: Gynecology;  Laterality: N/A;  . DILATION AND CURETTAGE OF UTERUS    . JOINT REPLACEMENT     right knee  . KNEE SURGERY    . LAPAROSCOPIC HYSTERECTOMY   09/20/2011   Procedure: HYSTERECTOMY TOTAL LAPAROSCOPIC;  Surgeon: Delice Lesch, MD;  Location: [facility];  Service: Gynecology;  Laterality: N/A;  . MYOMECTOMY ABDOMINAL APPROACH  04/03/2007  . TRANSVAGINAL TAPE (TVT) REMOVAL N/A 11/10/2012   Procedure: TRANSVAGINAL TAPE (TVT) REMOVAL;  Surgeon: Delice Lesch, MD;  Location: [facility];  Service: Gynecology;  Laterality: N/A;  Removal of Mesh  . WISDOM TOOTH EXTRACTION      reports that she quit smoking about 3 years ago. Her smoking use included cigarettes. She has a 3.00 pack-year smoking history. she has never used smokeless tobacco. She reports that she drinks alcohol. She reports that she does not use drugs. family history includes Asthma in her maternal grandmother; Breast cancer (age of onset: 37) in her daughter; Cancer in her father; Diabetes in her maternal grandmother and sister. Allergies  Allergen Reactions  . Dairy Aid [Lactase] Other (See Comments)    bloating  . Shellfish Allergy Hives  . Starch Other (See Comments)    bloating  . Tramadol Itching  . Cortisone Rash     Review of Systems  Constitutional: Negative for appetite change and unexpected weight change.  Respiratory: Negative for cough and shortness of breath.   Cardiovascular: Negative for chest pain.  Skin: Positive for rash.  Hematological: Negative for adenopathy.       Objective:  Physical Exam  Constitutional: She appears well-developed and well-nourished.  Pulmonary/Chest:  Breasts are symmetric with no mass. She has small mobile nontender approximately half centimeter subcutaneous rounded cystic lesion consistent with probable sebaceous cyst right axillary region. No adenopathy noted  Skin: Rash noted.  Patient has some urticarial type lesions left trapezius left lower neck region. No vesicles. Nontender. No other rash noted       Assessment:     #1 localized hives right lower neck  #2 sebaceous cyst right axillary- no signs of abscess.     Plan:     -Reassurance regarding right axilla -Try over-the-counter anti-histamine such as Zyrtec or Benadryl for rash -If not relieved with that- prednisone taper.  Eulas Post MD [facility]

## 2017-04-13 NOTE — Telephone Encounter (Signed)
she had listed as topical irritation.  I think she can try this.  Pharmacist is aware

## 2017-04-13 NOTE — Telephone Encounter (Signed)
Copied from [place] [id number]. Topic: General - Other >> Apr 13, 2017 10:25 AM Yvette Rack wrote: Reason for CRM: patient calling from [facility] stating that the pharmacist wanted to know if pt need another RX pt was given predniSONE (DELTASONE) 10 MG but she's allergic to Cortisone or do he want her to stay on this medicine

## 2017-06-02 ENCOUNTER — Encounter: Payer: Self-pay | Admitting: Family Medicine

## 2017-06-02 ENCOUNTER — Ambulatory Visit: Payer: Federal, State, Local not specified - PPO | Admitting: Family Medicine

## 2017-06-02 VITALS — BP 102/72 | HR 65 | Temp 97.4°F | Ht 67.5 in | Wt 195.8 lb

## 2017-06-02 DIAGNOSIS — M79672 Pain in left foot: Secondary | ICD-10-CM | POA: Diagnosis not present

## 2017-06-02 NOTE — Progress Notes (Signed)
HPI:  Using dictation device. Unfortunately this device frequently misinterprets words/phrases.  Acute visit for L heal pain: -walking funny due to hx R knee replacement - seeing ortho for this -now L plantar heal pain for a few weeks -better the last few days -no weakness or numbness -not taking any medications for this  ROS: See pertinent positives and negatives per HPI.  Past Medical History:  Diagnosis Date  . Allergy   . Arthritis    Rheumatoid - knees  . Fibroid 04/03/2007  . H/O dysmenorrhea 2009  . H/O menorrhagia 2007  . H/O varicella   . Hepatitis B 11/14/2006  . History of measles, mumps, or rubella   . History of nausea 2009   With menses   . Irregular menses   . Monilial vaginitis 2006  . Pain pelvic   . Seasonal allergies   . Spasm of muscle, back    09-08-2013, therepy helped  . SVD (spontaneous vaginal delivery)    x 1  . Urge incontinence 2008    Past Surgical History:  Procedure Laterality Date  . BLADDER SUSPENSION  09/20/2011   Procedure: TRANSVAGINAL TAPE (TVT) PROCEDURE;  Surgeon: Delice Lesch, MD;  Location: [facility];  Service: Gynecology;  Laterality: N/A;  . BREAST BIOPSY Left   . CYSTOSCOPY  09/20/2011   Procedure: CYSTOSCOPY;  Surgeon: Delice Lesch, MD;  Location: [facility];  Service: Gynecology;;  . Consuela Mimes N/A 11/10/2012   Procedure: CYSTOSCOPY;  Surgeon: Delice Lesch, MD;  Location: [facility];  Service: Gynecology;  Laterality: N/A;  . DILATION AND CURETTAGE OF UTERUS    . JOINT REPLACEMENT     right knee  . KNEE SURGERY    . LAPAROSCOPIC HYSTERECTOMY  09/20/2011   Procedure: HYSTERECTOMY TOTAL LAPAROSCOPIC;  Surgeon: Delice Lesch, MD;  Location: [facility];  Service: Gynecology;  Laterality: N/A;  . MYOMECTOMY ABDOMINAL APPROACH  04/03/2007  . TRANSVAGINAL TAPE (TVT) REMOVAL N/A 11/10/2012   Procedure: TRANSVAGINAL TAPE (TVT) REMOVAL;  Surgeon: Delice Lesch, MD;  Location: [facility];  Service: Gynecology;  Laterality: N/A;  Removal  of Mesh  . WISDOM TOOTH EXTRACTION      Family History  Problem Relation Age of Onset  . Cancer Father        Lung  . Asthma Maternal Grandmother   . Diabetes Maternal Grandmother   . Diabetes Sister   . Breast cancer Daughter 33  . Colon cancer Neg Hx   . Esophageal cancer Neg Hx   . Pancreatic cancer Neg Hx   . Rectal cancer Neg Hx   . Stomach cancer Neg Hx     SOCIAL HX: see above   Current Outpatient Medications:  .  ibuprofen (ADVIL,MOTRIN) 600 MG tablet, ibuprofen 600 mg tablet, Disp: , Rfl:   EXAM:  Vitals:   06/02/17 1041  BP: 102/72  Pulse: 65  Temp: (!) 97.4 F (36.3 C)    Body mass index is 30.21 kg/m.  GENERAL: vitals reviewed and listed above, alert, oriented, appears well hydrated and in no acute distress  HEENT: atraumatic, conjunttiva clear, no obvious abnormalities on inspection of external nose and ears  NECK: no obvious masses on inspection  MS: moves all extremities without noticeable abnormality, some collapse long arch L foot, TTP plantar aspect foot at prox plantar fascia attachment, normal pedal pulses and cap refill, NV intact distal  PSYCH: pleasant and cooperative, no obvious depression or anxiety  ASSESSMENT AND PLAN:  Discussed the following assessment  and plan:  Left foot pain  --we discussed possible serious and likely etiologies, workup and treatment, treatment risks and return precautions - likely plantar fasciitis -after this discussion, Amy opted for ice, nsaids, strassburg sock (discussed where she can purchase), regular gentle activities but no running or strenuous exercise -follow up advised in 2-3 weeks -of course, we advised Cole  to return or notify a doctor immediately if symptoms worsen or persist or new concerns arise.  Patient Instructions  BEFORE YOU LEAVE: -follow up with PCP 2-3 weeks  Ice 2-3 times daily  [place]assburg sock  Aleve 1-2 times daily for one week - do not mix with other pain  medications.  Topical menthol - tiger balm can also help  I hope you are feeling better soon...hang in there. Seek care promptly if your symptoms worsen, new concerns arise or you are not improving with treatment.      Lucretia Kern, DO

## 2017-06-02 NOTE — Patient Instructions (Signed)
BEFORE YOU LEAVE: -follow up with PCP 2-3 weeks  Ice 2-3 times daily  Strassburg sock  Aleve 1-2 times daily for one week - do not mix with other pain medications.  Topical menthol - tiger balm can also help  I hope you are feeling better soon...hang in there. Seek care promptly if your symptoms worsen, new concerns arise or you are not improving with treatment.

## 2017-06-13 DIAGNOSIS — M19072 Primary osteoarthritis, left ankle and foot: Secondary | ICD-10-CM | POA: Diagnosis not present

## 2017-06-17 ENCOUNTER — Ambulatory Visit: Payer: Federal, State, Local not specified - PPO | Admitting: Family Medicine

## 2018-09-18 ENCOUNTER — Encounter: Payer: Self-pay | Admitting: Gastroenterology

## 2018-10-17 ENCOUNTER — Other Ambulatory Visit: Payer: Self-pay

## 2018-10-17 ENCOUNTER — Ambulatory Visit: Payer: Self-pay | Admitting: *Deleted

## 2018-10-17 ENCOUNTER — Ambulatory Visit (HOSPITAL_COMMUNITY)
Admission: EM | Admit: 2018-10-17 | Discharge: 2018-10-17 | Disposition: A | Discharge status: Home / Self Care | Payer: Federal, State, Local not specified - PPO | Attending: Urgent Care | Admitting: Urgent Care

## 2018-10-17 ENCOUNTER — Encounter (HOSPITAL_COMMUNITY): Payer: Self-pay

## 2018-10-17 DIAGNOSIS — R0789 Other chest pain: Secondary | ICD-10-CM

## 2018-10-17 DIAGNOSIS — R200 Anesthesia of skin: Secondary | ICD-10-CM

## 2018-10-17 MED ORDER — FAMOTIDINE 20 MG PO TABS: 20.0000 mg | ORAL_TABLET | Freq: Two times a day (BID) | ORAL | 0 refills | Status: DC

## 2018-10-17 NOTE — Telephone Encounter (Signed)
   Reason for Disposition . [1] Numbness (i.e., loss of sensation) of the face, arm / hand, or leg / foot on one side of the body AND [2] sudden onset AND [3] brief (now gone)  Answer Assessment - Initial Assessment Questions 1. SYMPTOM: "What is the main symptom you are concerned about?" (e.g., weakness, numbness)     Numbness in left leg yesterday, lasted  5 minutes, having mild tingling in left arm and leg today  2. ONSET: "When did this start?" (minutes, hours, days; while sleeping)     Yesterday with numbness in the leg lasted 5 minutes, tingling in left arm and left leg  3. LAST NORMAL: "When was the last time you were normal (no symptoms)?"     Numbness started around 7:30 pm last night  4. PATTERN "Does this come and go, or has it been constant since it started?"  "Is it present now?"    Tingling present now, numbness gone  5. CARDIAC SYMPTOMS: "Have you had any of the following symptoms: chest pain, difficulty breathing, palpitations?"     No chest pain, difficulty breathing or palpatations  6. NEUROLOGIC SYMPTOMS: "Have you had any of the following symptoms: headache, dizziness, vision loss, double vision, changes in speech, unsteady on your feet?"     Feels slightly weaker in the left leg 7. OTHER SYMPTOMS: "Do you have any other symptoms?"     Severe heart burn around the same time as numbness and tingling 8. PREGNANCY: "Is there any chance you are pregnant?" "When was your last menstrual period?"     N/A  Protocols used: NEUROLOGIC DEFICIT-A-AH  Patient states she notices she was having left leg numbness last evening around 7:30 pm that last for 5 minutes then subsided.  States today she has noticed tingling in her left arm and leg.  She denies chest pain, shortness of breath, vision problems, changes in speech, unsteady gait, headache or dizziness.  States she also had severe heartburn in her throat around the same time as she experienced the numbness yesterday.  States she had  eaten a lot of chili flavored Fritos, and thought this may have been the cause.  She states she ate ginger and this helped heartburn.  She is not experiencing heartburn today, but states she feels bloated.  She states she has been having a lot of gas today.  Advised patient she should have someone drive her to [facility] for evaluation now due to brief numbness yesterday and "heartburn" that could have been cardiac in nature.  Patient expressed understanding, and is agreeable with plan.

## 2018-10-17 NOTE — Discharge Instructions (Signed)

## 2018-10-17 NOTE — ED Triage Notes (Signed)
Pt states she is having left thigh and leg pain. Pt states she has been having heart burn.

## 2018-10-17 NOTE — ED Provider Notes (Signed)
MRN: [id number] DOB: 06-Sep-1960  Subjective:   Sandra Hawkins is a 58 y.o. female presenting for 1 day history of 1 episode of moderate-severe mid-upper sternal  chest pain after eating a bag of Frito's. Had a bad taste in her mouth as well.  Sx lasted ~10 minutes and has not recurred. Sx resolved after eating pickled ginger. Reports concerns about having concurrent left thigh and lower leg numbness last night. Had tingling in her left arm today, soreness in her muscles. Takes ibuprofen for plantar fasciitis as needed.  Denies history of clotting disorder, history of DVT, chest PE.  Denies history of heart disease, diabetes, hypertension, hyperlipidemia.  Patient has not had any recent hospitalizations, surgeries or long distance travel.  Denies diaphoresis, radiation of her chest pain to her back, abdomen, neck or jaw, left arm, nausea, vomiting.  Denies headache, confusion, dizziness.   No current facility-administered medications for this encounter.   Current Outpatient Medications:  .  ibuprofen (ADVIL,MOTRIN) 600 MG tablet, ibuprofen 600 mg tablet, Disp: , Rfl:     Allergies  Allergen Reactions  . Dairy Aid [Lactase] Other (See Comments)    bloating  . Shellfish Allergy Hives  . Starch Other (See Comments)    bloating  . Tramadol Itching  . Cortisone Rash    Past Medical History:  Diagnosis Date  . Allergy   . Arthritis    Rheumatoid - knees  . Fibroid 04/03/2007  . H/O dysmenorrhea 2009  . H/O menorrhagia 2007  . H/O varicella   . Hepatitis B 11/14/2006  . History of measles, mumps, or rubella   . History of nausea 2009   With menses   . Irregular menses   . Monilial vaginitis 2006  . Pain pelvic   . Seasonal allergies   . Spasm of muscle, back    09-08-2013, therepy helped  . SVD (spontaneous vaginal delivery)    x 1  . Urge incontinence 2008     Past Surgical History:  Procedure Laterality Date  . BLADDER SUSPENSION  09/20/2011   Procedure: TRANSVAGINAL  TAPE (TVT) PROCEDURE;  Surgeon: Delice Lesch, MD;  Location: [facility];  Service: Gynecology;  Laterality: N/A;  . BREAST BIOPSY Left   . CYSTOSCOPY  09/20/2011   Procedure: CYSTOSCOPY;  Surgeon: Delice Lesch, MD;  Location: [facility];  Service: Gynecology;;  . Consuela Mimes N/A 11/10/2012   Procedure: CYSTOSCOPY;  Surgeon: Delice Lesch, MD;  Location: [facility];  Service: Gynecology;  Laterality: N/A;  . DILATION AND CURETTAGE OF UTERUS    . JOINT REPLACEMENT     right knee  . KNEE SURGERY    . LAPAROSCOPIC HYSTERECTOMY  09/20/2011   Procedure: HYSTERECTOMY TOTAL LAPAROSCOPIC;  Surgeon: Delice Lesch, MD;  Location: [facility];  Service: Gynecology;  Laterality: N/A;  . MYOMECTOMY ABDOMINAL APPROACH  04/03/2007  . TRANSVAGINAL TAPE (TVT) REMOVAL N/A 11/10/2012   Procedure: TRANSVAGINAL TAPE (TVT) REMOVAL;  Surgeon: Delice Lesch, MD;  Location: [facility];  Service: Gynecology;  Laterality: N/A;  Removal of Mesh  . WISDOM TOOTH EXTRACTION      Family History  Problem Relation Age of Onset  . Cancer Father        Lung  . Asthma Maternal Grandmother   . Diabetes Maternal Grandmother   . Diabetes Sister   . Breast cancer Daughter 25  . Colon cancer Neg Hx   . Esophageal cancer Neg Hx   . Pancreatic cancer Neg Hx   .  Rectal cancer Neg Hx   . Stomach cancer Neg Hx     Social History   Tobacco Use  . Smoking status: Former Smoker    Packs/day: 1.00    Years: 3.00    Pack years: 3.00    Types: Cigarettes    Quit date: 09/24/2013    Years since quitting: 5.0  . Smokeless tobacco: Never Used  Substance Use Topics  . Alcohol use: Yes    Comment: rsre  . Drug use: No     ROS  Objective:   Vitals: BP 126/77 (BP Location: Right Arm)   Pulse 66   Temp (!) 97.5 F (36.4 C) (Oral)   Resp 18   Wt 205 lb (93 kg)   LMP 09/13/2011   SpO2 100%   BMI 31.63 kg/m   Physical Exam Constitutional:      General: She is not in acute distress.    Appearance: Normal appearance. She is  well-developed and normal weight. She is not ill-appearing, toxic-appearing or diaphoretic.  HENT:     Head: Normocephalic and atraumatic.     Right Ear: External ear normal.     Left Ear: External ear normal.     Nose: Nose normal.     Mouth/Throat:     Mouth: Mucous membranes are moist.     Pharynx: Oropharynx is clear.  Eyes:     General: No scleral icterus.    Extraocular Movements: Extraocular movements intact.     Pupils: Pupils are equal, round, and reactive to light.  Cardiovascular:     Rate and Rhythm: Normal rate and regular rhythm.     Pulses: Normal pulses.     Heart sounds: Normal heart sounds. No murmur. No friction rub. No gallop.   Pulmonary:     Effort: Pulmonary effort is normal. No respiratory distress.     Breath sounds: Normal breath sounds. No stridor. No wheezing, rhonchi or rales.  Abdominal:     General: Bowel sounds are normal. There is no distension.     Palpations: Abdomen is soft. There is no mass.     Tenderness: There is no abdominal tenderness. There is no right CVA tenderness, left CVA tenderness, guarding or rebound.  Musculoskeletal: Normal range of motion.        General: No swelling or tenderness.     Right lower leg: No edema.     Left lower leg: No edema.     Comments: Strength 5 out of 5 throughout.  Skin:    General: Skin is warm and dry.     Coloration: Skin is not jaundiced or pale.     Findings: No bruising, erythema or rash.  Neurological:     General: No focal deficit present.     Mental Status: She is alert and oriented to person, place, and time.     Cranial Nerves: No cranial nerve deficit.     Motor: No weakness.     Coordination: Coordination normal.     Gait: Gait normal.     Deep Tendon Reflexes: Reflexes normal.     Comments: Negative Romberg and pronator drift.  Psychiatric:        Mood and Affect: Mood normal.        Behavior: Behavior normal.        Thought Content: Thought content normal.        Judgment:  Judgment normal.     ED ECG REPORT   Date: 10/17/2018  Rate: 68bpm  Rhythm:  normal sinus rhythm  QRS Axis: normal  Intervals: normal  ST/T Wave abnormalities: normal  Conduction Disutrbances:none  Narrative Interpretation: Normal sinus rhythm at 68 bpm.  Old EKG Reviewed: Unchanged from sinus rhythm EKG on 09/10/2013.  I have personally reviewed the EKG tracing and agree with the computerized printout as noted.   Assessment and Plan :   1. Atypical chest pain   2. Left leg numbness     Low suspicion for intracranial process, thrombotic event, ACS, aortic dissection.  Counseled patient on signs and symptoms warranting ER visit.  Physical exam findings and EKG reassuring.  We will have patient make dietary modifications, counseled on GERD causing foods.  She is to start famotidine as needed.  Follow-up with PCP. Counseled patient on potential for adverse effects with medications prescribed/recommended today, ER and return-to-clinic precautions discussed, patient verbalized understanding.    Jaynee Eagles, [place] 10/17/18 1439

## 2019-01-22 ENCOUNTER — Other Ambulatory Visit: Payer: Self-pay

## 2019-01-22 DIAGNOSIS — Z20822 Contact with and (suspected) exposure to covid-19: Secondary | ICD-10-CM

## 2019-01-23 LAB — NOVEL CORONAVIRUS, NAA: SARS-CoV-2, NAA: NOT DETECTED

## 2019-04-19 ENCOUNTER — Ambulatory Visit: Payer: Federal, State, Local not specified - PPO | Attending: Internal Medicine

## 2019-04-19 DIAGNOSIS — Z23 Encounter for immunization: Secondary | ICD-10-CM

## 2019-04-19 NOTE — Progress Notes (Signed)
   Covid-19 Vaccination Clinic  Name:  Sandra Hawkins    MRN: [id number] DOB: 1960/09/11  04/19/2019  [name] was observed post Covid-19 immunization for 15 minutes without incident. She was provided with Vaccine Information Sheet and instruction to access the V-Safe system.   [name] was instructed to call 911 with any severe reactions post vaccine: Marland Kitchen Difficulty breathing  . Swelling of face and throat  . A fast heartbeat  . A bad rash all over body  . Dizziness and weakness   Immunizations Administered    Name Date Dose VIS Date Route   Pfizer COVID-19 Vaccine 04/19/2019 11:55 AM 0.3 mL 01/19/2019 Intramuscular   Manufacturer: [place]   Lot: [id number]   [place]: [id number]

## 2019-05-10 ENCOUNTER — Emergency Department (HOSPITAL_COMMUNITY)
Admission: EM | Admit: 2019-05-10 | Discharge: 2019-05-11 | Disposition: A | Discharge status: Home / Self Care | Payer: Federal, State, Local not specified - PPO | Attending: Emergency Medicine | Admitting: Emergency Medicine

## 2019-05-10 ENCOUNTER — Other Ambulatory Visit: Payer: Self-pay

## 2019-05-10 ENCOUNTER — Emergency Department (HOSPITAL_COMMUNITY): Payer: Federal, State, Local not specified - PPO

## 2019-05-10 DIAGNOSIS — Y929 Unspecified place or not applicable: Secondary | ICD-10-CM | POA: Insufficient documentation

## 2019-05-10 DIAGNOSIS — M1909 Primary osteoarthritis, other specified site: Secondary | ICD-10-CM | POA: Diagnosis not present

## 2019-05-10 DIAGNOSIS — Z96651 Presence of right artificial knee joint: Secondary | ICD-10-CM | POA: Insufficient documentation

## 2019-05-10 DIAGNOSIS — X501XXA Overexertion from prolonged static or awkward postures, initial encounter: Secondary | ICD-10-CM | POA: Diagnosis not present

## 2019-05-10 DIAGNOSIS — Z87891 Personal history of nicotine dependence: Secondary | ICD-10-CM | POA: Insufficient documentation

## 2019-05-10 DIAGNOSIS — M08069 Unspecified juvenile rheumatoid arthritis, unspecified knee: Secondary | ICD-10-CM | POA: Insufficient documentation

## 2019-05-10 DIAGNOSIS — S82142A Displaced bicondylar fracture of left tibia, initial encounter for closed fracture: Secondary | ICD-10-CM

## 2019-05-10 DIAGNOSIS — Z79899 Other long term (current) drug therapy: Secondary | ICD-10-CM | POA: Insufficient documentation

## 2019-05-10 DIAGNOSIS — W208XXA Other cause of strike by thrown, projected or falling object, initial encounter: Secondary | ICD-10-CM | POA: Diagnosis not present

## 2019-05-10 DIAGNOSIS — Y999 Unspecified external cause status: Secondary | ICD-10-CM | POA: Diagnosis not present

## 2019-05-10 DIAGNOSIS — S8992XA Unspecified injury of left lower leg, initial encounter: Secondary | ICD-10-CM | POA: Diagnosis present

## 2019-05-10 DIAGNOSIS — Y939 Activity, unspecified: Secondary | ICD-10-CM | POA: Insufficient documentation

## 2019-05-10 MED ORDER — MELOXICAM 7.5 MG PO TABS
15.0000 mg | ORAL_TABLET | Freq: Every day | ORAL | Status: DC
  Administered 2019-05-11: 15 mg via ORAL
  Filled 2019-05-10: qty 2

## 2019-05-10 NOTE — ED Notes (Signed)
Pt taken to and  from xray

## 2019-05-10 NOTE — ED Notes (Signed)
Ortho at bedside to place pt's knee in immobilizer

## 2019-05-10 NOTE — ED Triage Notes (Signed)
Pt reports she was carrying heavy bag of rocks when it fell on her L foot. Pain is actually C/O pain in her L knee, states she think she hurt it when trying to pull her foot out from under the bag.

## 2019-05-10 NOTE — ED Notes (Signed)
EDP at bedside  

## 2019-05-10 NOTE — ED Provider Notes (Signed)
[facility] EMERGENCY DEPARTMENT Provider Note   CSN: [id number] Arrival date & time: 05/10/19  2007     History Chief Complaint  Patient presents with  . Knee Pain    Sandra Hawkins is a 59 y.o. female.  59 year old female presents to the emergency department for evaluation of left knee pain which was sustained 12 hours ago.  Pain has been constant and is worse with weightbearing.  Onset of discomfort came after patient's foot became trapped under a barrel of heavy rocks causing her to fall to the ground and twist her knee. She has taken a "fluid pill" for swelling without relief. No numbness, paresthesias, weakness of the extremity. Is followed by [facility] since prior R knee replacement. Not chronically anticoagulated.  The history is provided by the patient. No language interpreter was used.  Knee Pain      Past Medical History:  Diagnosis Date  . Allergy   . Arthritis    Rheumatoid - knees  . Fibroid 04/03/2007  . H/O dysmenorrhea 2009  . H/O menorrhagia 2007  . H/O varicella   . Hepatitis B 11/14/2006  . History of measles, mumps, or rubella   . History of nausea 2009   With menses   . Irregular menses   . Monilial vaginitis 2006  . Pain pelvic   . Seasonal allergies   . Spasm of muscle, back    09-08-2013, therepy helped  . SVD (spontaneous vaginal delivery)    x 1  . Urge incontinence 2008    Patient Active Problem List   Diagnosis Date Noted  . OSTEOARTHRITIS 02/26/2010  . HEPATITIS B, ACUTE 12/14/2006  . FIBROIDS, UTERUS - s/p TLH and TVT on 09/20/11 12/14/2006  . DEGENERATIVE JOINT DISEASE, KNEE 12/14/2006    Past Surgical History:  Procedure Laterality Date  . BLADDER SUSPENSION  09/20/2011   Procedure: TRANSVAGINAL TAPE (TVT) PROCEDURE;  Surgeon: Delice Lesch, MD;  Location: [facility];  Service: Gynecology;  Laterality: N/A;  . BREAST BIOPSY Left   . CYSTOSCOPY  09/20/2011   Procedure: CYSTOSCOPY;  Surgeon: Delice Lesch, MD;  Location: [facility];  Service: Gynecology;;  . Consuela Mimes N/A 11/10/2012   Procedure: CYSTOSCOPY;  Surgeon: Delice Lesch, MD;  Location: [facility];  Service: Gynecology;  Laterality: N/A;  . DILATION AND CURETTAGE OF UTERUS    . JOINT REPLACEMENT     right knee  . KNEE SURGERY    . LAPAROSCOPIC HYSTERECTOMY  09/20/2011   Procedure: HYSTERECTOMY TOTAL LAPAROSCOPIC;  Surgeon: Delice Lesch, MD;  Location: [facility];  Service: Gynecology;  Laterality: N/A;  . MYOMECTOMY ABDOMINAL APPROACH  04/03/2007  . TRANSVAGINAL TAPE (TVT) REMOVAL N/A 11/10/2012   Procedure: TRANSVAGINAL TAPE (TVT) REMOVAL;  Surgeon: Delice Lesch, MD;  Location: [facility];  Service: Gynecology;  Laterality: N/A;  Removal of Mesh  . WISDOM TOOTH EXTRACTION       OB History    Gravida  3   Para  1   Term      Preterm      AB  2   Living  1     SAB  2   TAB      Ectopic      Multiple      Live Births  1           Family History  Problem Relation Age of Onset  . Cancer Father        Lung  .  Asthma Maternal Grandmother   . Diabetes Maternal Grandmother   . Diabetes Sister   . Breast cancer Daughter 67  . Colon cancer Neg Hx   . Esophageal cancer Neg Hx   . Pancreatic cancer Neg Hx   . Rectal cancer Neg Hx   . Stomach cancer Neg Hx     Social History   Tobacco Use  . Smoking status: Former Smoker    Packs/day: 1.00    Years: 3.00    Pack years: 3.00    Types: Cigarettes    Quit date: 09/24/2013    Years since quitting: 5.6  . Smokeless tobacco: Never Used  Substance Use Topics  . Alcohol use: Yes    Comment: rsre  . Drug use: No    Home Medications Prior to Admission medications   Medication Sig Start Date End Date Taking? Authorizing Provider  APPLE CIDER VINEGAR PO Take 1 tablet by mouth 4 (four) times daily. [place]   Yes [provider]  Multiple Vitamin (MULTIVITAMIN WITH MINERALS) TABS tablet Take 1 tablet by mouth daily.   Yes [provider]   famotidine (PEPCID) 20 MG tablet Take 1 tablet (20 mg total) by mouth 2 (two) times daily. Patient not taking: Reported on 05/10/2019 10/17/18   Jaynee Eagles, PA-C  HYDROcodone-acetaminophen (NORCO/VICODIN) 5-325 MG tablet Take 1 tablet by mouth every 6 (six) hours as needed for severe pain. 05/11/19   Antonietta Breach, PA-C  meloxicam (MOBIC) 7.5 MG tablet Take 2 tablets (15 mg total) by mouth daily. 05/11/19   Antonietta Breach, PA-C    Allergies    Dairy aid [lactase], Shellfish allergy, Starch, Tramadol, and Cortisone  Review of Systems   Review of Systems  Ten systems reviewed and are negative for acute change, except as noted in the HPI.    Physical Exam Updated Vital Signs BP 117/74 (BP Location: Right Arm)   Pulse 92   Temp 98.4 F (36.9 C) (Oral)   Resp 18   LMP 09/13/2011   SpO2 100%   Physical Exam Vitals and nursing note reviewed.  Constitutional:      General: She is not in acute distress.    Appearance: She is well-developed. She is not diaphoretic.     Comments: Nontoxic-appearing and in no acute distress  HENT:     Head: Normocephalic and atraumatic.  Eyes:     General: No scleral icterus.    Conjunctiva/sclera: Conjunctivae normal.  Cardiovascular:     Rate and Rhythm: Normal rate and regular rhythm.     Pulses: Normal pulses.     Comments: DP pulse 2+ in the left lower extremity. Pulmonary:     Effort: Pulmonary effort is normal. No respiratory distress.  Musculoskeletal:     Cervical back: Normal range of motion.     Comments: Limited flexion secondary to pain.  There is tenderness to palpation along the lateral aspect of the left knee.  Associated palpable effusion with mild bruising.  No crepitus or deformity. Compartments of the LLE are compressible.  Skin:    General: Skin is warm and dry.     Coloration: Skin is not pale.     Findings: No erythema or rash.  Neurological:     Mental Status: She is alert and oriented to person, place, and time.     Coordination:  Coordination normal.     Comments: Sensation to light touch intact.  Dorsiflexion and plantar flexion 5/5 against resistance in the left lower extremity.  Psychiatric:  Behavior: Behavior normal.     ED Results / Procedures / Treatments   Labs (all labs ordered are listed, but only abnormal results are displayed) Labs Reviewed - No data to display  EKG None  Radiology DG Knee Complete 4 Views Left  Result Date: 05/10/2019 CLINICAL DATA:  Left knee pain following fall, initial encounter EXAM: LEFT KNEE - COMPLETE 4+ VIEW COMPARISON:  None. FINDINGS: There is a lateral tibial plateau fracture with mild impaction of the fracture fragment. Large joint effusion is seen. No other fracture is identified. Degenerative changes are seen as well. IMPRESSION: Lateral tibial plateau fracture with large joint effusion. Mild degenerative changes are noted as well. Electronically Signed   By: Inez Catalina M.D.   On: 05/10/2019 22:46    Procedures Procedures (including critical care time)  Medications Ordered in ED Medications  meloxicam (MOBIC) tablet 15 mg (has no administration in time range)    ED Course  I have reviewed the triage vital signs and the nursing notes.  Pertinent labs & imaging results that were available during my care of the patient were reviewed by me and considered in my medical decision making (see chart for details).  Clinical Course as of May 10 24  Thu May 10, 2019  2321 X-ray reviewed which shows a lateral tibial plateau fracture with joint effusion.  Patient has been followed by [facility] in the past.  Will place consult to discuss outpatient follow-up.  Orders placed for crutches and knee immobilizer.  We will also proceed with CT knee without contrast for further characterization of injury.  Anticipate discharge and close outpatient orthopedic follow-up.   [KH]  2322 Case discussed with Dr. Ronnie Derby who agrees with plan.    [KH]    Clinical  Course User Index [KH] Beverely Pace   MDM Rules/Calculators/A&P                      59 year old female presents to the emergency department for evaluation of left knee pain.  Neurovascularly intact with soft compartments.  Found to have a lateral left tibial plateau fracture on x-ray.  CT completed for further characterization of injury.  The patient was placed in a knee immobilizer and given crutches to remain nonweightbearing.  She has been instructed to follow-up with her orthopedist.  Counseled on icing as well as the use of NSAIDs.  Short course of Norco given for pain control.  Return precautions discussed and provided.  Patient discharged in stable condition with no unaddressed concerns.   Final Clinical Impression(s) / ED Diagnoses Final diagnoses:  Closed fracture of left tibial plateau, initial encounter    Rx / DC Orders ED Discharge Orders         Ordered    meloxicam (MOBIC) 7.5 MG tablet  Daily     05/11/19 0024    HYDROcodone-acetaminophen (NORCO/VICODIN) 5-325 MG tablet  Every 6 hours PRN     05/11/19 0024           Antonietta Breach, PA-C 05/11/19 [id number]    Davonna Belling, MD 05/11/19 2240

## 2019-05-11 ENCOUNTER — Emergency Department (HOSPITAL_COMMUNITY): Payer: Federal, State, Local not specified - PPO

## 2019-05-11 MED ORDER — MELOXICAM 7.5 MG PO TABS: 15.0000 mg | ORAL_TABLET | Freq: Every day | ORAL | 0 refills | Status: DC

## 2019-05-11 MED ORDER — HYDROCODONE-ACETAMINOPHEN 5-325 MG PO TABS: 1.0000 | ORAL_TABLET | Freq: Four times a day (QID) | ORAL | 0 refills | Status: DC | PRN

## 2019-05-11 NOTE — ED Notes (Signed)
Crutches and crutch teaching provided by ortho tech

## 2019-05-11 NOTE — ED Notes (Signed)
Mobic given per MAR. Name/DOB verified with pt

## 2019-05-11 NOTE — ED Notes (Signed)
Patient verbalizes understanding of discharge instructions, prescription medications, and follow up care. Opportunity for questioning and answers were provided. All questions answered completely. Armband removed by staff, pt discharged from ED. Wheeled from ED

## 2019-05-11 NOTE — Progress Notes (Signed)
Orthopedic Tech Progress Note Patient Details:  KAYLIYAH LANDEN 07/07/1960 [id number]  Ortho Devices Type of Ortho Device: Crutches Ortho Device/Splint Location: LE Ortho Device/Splint Interventions: Application   Post Interventions Patient Tolerated: Well Instructions Provided: Care of device, Adjustment of device   Jahniah Pallas E Vonita Calloway 05/11/2019, 12:17 AM

## 2019-05-11 NOTE — ED Notes (Signed)
Pt taken to CT.

## 2019-05-11 NOTE — Progress Notes (Signed)
Orthopedic Tech Progress Note Patient Details:  Sandra Hawkins 04-Sep-1960 [id number]  Ortho Devices Type of Ortho Device: Knee Immobilizer Ortho Device/Splint Location: LLE Ortho Device/Splint Interventions: Application   Post Interventions Patient Tolerated: Well Instructions Provided: Care of device   Sandra Hawkins [name] 05/11/2019, 12:01 AM

## 2019-05-11 NOTE — Discharge Instructions (Addendum)
Do not put any weight on your left leg.  Keep your knee immobilizer on at all times except when bathing/showering.  Take Mobic as prescribed for pain.  Ice your knee 3-4 times per day to help limit inflammation.  We recommend close follow-up with your orthopedist within the week to discuss management of your broken bone.  You may return for any new or concerning symptoms.

## 2019-05-14 ENCOUNTER — Ambulatory Visit: Payer: Federal, State, Local not specified - PPO

## 2019-05-16 ENCOUNTER — Other Ambulatory Visit: Payer: Self-pay | Admitting: Orthopedic Surgery

## 2019-05-16 DIAGNOSIS — M25562 Pain in left knee: Secondary | ICD-10-CM

## 2019-05-17 ENCOUNTER — Ambulatory Visit
Admission: RE | Admit: 2019-05-17 | Discharge: 2019-05-17 | Disposition: A | Discharge status: Home / Self Care | Payer: Federal, State, Local not specified - PPO | Source: Ambulatory Visit | Attending: Orthopedic Surgery | Admitting: Orthopedic Surgery

## 2019-05-17 DIAGNOSIS — M25562 Pain in left knee: Secondary | ICD-10-CM

## 2019-05-23 ENCOUNTER — Ambulatory Visit: Payer: Federal, State, Local not specified - PPO | Attending: Internal Medicine

## 2019-05-23 DIAGNOSIS — Z23 Encounter for immunization: Secondary | ICD-10-CM

## 2019-05-23 NOTE — Progress Notes (Signed)
   Covid-19 Vaccination Clinic  Name:  Sandra Hawkins    MRN: [id number] DOB: 09-Apr-1960  05/23/2019  [name] was observed post Covid-19 immunization for 30 minutes based on pre-vaccination screening without incident. She was provided with Vaccine Information Sheet and instruction to access the V-Safe system.   [name] was instructed to call 911 with any severe reactions post vaccine: Marland Kitchen Difficulty breathing  . Swelling of face and throat  . A fast heartbeat  . A bad rash all over body  . Dizziness and weakness   Immunizations Administered    Name Date Dose VIS Date Route   Pfizer COVID-19 Vaccine 05/23/2019  2:43 PM 0.3 mL 01/19/2019 Intramuscular   Manufacturer: [place]   Lot: [id number]   [place]: [id number]

## 2020-10-14 ENCOUNTER — Encounter: Payer: Self-pay | Admitting: Gastroenterology

## 2020-10-29 DIAGNOSIS — Z131 Encounter for screening for diabetes mellitus: Secondary | ICD-10-CM | POA: Diagnosis not present

## 2020-10-29 DIAGNOSIS — Z1231 Encounter for screening mammogram for malignant neoplasm of breast: Secondary | ICD-10-CM | POA: Diagnosis not present

## 2020-10-29 DIAGNOSIS — S40862A Insect bite (nonvenomous) of left upper arm, initial encounter: Secondary | ICD-10-CM | POA: Diagnosis not present

## 2020-10-29 DIAGNOSIS — Z0000 Encounter for general adult medical examination without abnormal findings: Secondary | ICD-10-CM | POA: Diagnosis not present

## 2020-11-28 DIAGNOSIS — E559 Vitamin D deficiency, unspecified: Secondary | ICD-10-CM | POA: Diagnosis not present

## 2020-11-28 DIAGNOSIS — E8881 Metabolic syndrome: Secondary | ICD-10-CM | POA: Diagnosis not present

## 2020-11-28 DIAGNOSIS — R0602 Shortness of breath: Secondary | ICD-10-CM | POA: Diagnosis not present

## 2020-11-28 DIAGNOSIS — Z6831 Body mass index (BMI) 31.0-31.9, adult: Secondary | ICD-10-CM | POA: Diagnosis not present

## 2020-11-28 DIAGNOSIS — R635 Abnormal weight gain: Secondary | ICD-10-CM | POA: Diagnosis not present

## 2020-11-28 DIAGNOSIS — R5381 Other malaise: Secondary | ICD-10-CM | POA: Diagnosis not present

## 2020-12-18 DIAGNOSIS — R7303 Prediabetes: Secondary | ICD-10-CM | POA: Diagnosis not present

## 2020-12-18 DIAGNOSIS — E559 Vitamin D deficiency, unspecified: Secondary | ICD-10-CM | POA: Diagnosis not present

## 2020-12-18 DIAGNOSIS — R928 Other abnormal and inconclusive findings on diagnostic imaging of breast: Secondary | ICD-10-CM | POA: Diagnosis not present

## 2020-12-18 DIAGNOSIS — Z01419 Encounter for gynecological examination (general) (routine) without abnormal findings: Secondary | ICD-10-CM | POA: Diagnosis not present

## 2020-12-31 ENCOUNTER — Other Ambulatory Visit: Payer: Self-pay | Admitting: Obstetrics and Gynecology

## 2020-12-31 DIAGNOSIS — Z09 Encounter for follow-up examination after completed treatment for conditions other than malignant neoplasm: Secondary | ICD-10-CM

## 2021-01-02 DIAGNOSIS — Z6831 Body mass index (BMI) 31.0-31.9, adult: Secondary | ICD-10-CM | POA: Diagnosis not present

## 2021-01-02 DIAGNOSIS — F5081 Binge eating disorder: Secondary | ICD-10-CM | POA: Diagnosis not present

## 2021-01-02 DIAGNOSIS — R635 Abnormal weight gain: Secondary | ICD-10-CM | POA: Diagnosis not present

## 2021-01-02 DIAGNOSIS — E8881 Metabolic syndrome: Secondary | ICD-10-CM | POA: Diagnosis not present

## 2021-01-19 ENCOUNTER — Other Ambulatory Visit: Payer: Self-pay | Admitting: Obstetrics and Gynecology

## 2021-01-19 DIAGNOSIS — N6489 Other specified disorders of breast: Secondary | ICD-10-CM

## 2021-02-01 DIAGNOSIS — Z96651 Presence of right artificial knee joint: Secondary | ICD-10-CM | POA: Diagnosis not present

## 2021-02-01 DIAGNOSIS — M791 Myalgia, unspecified site: Secondary | ICD-10-CM | POA: Diagnosis not present

## 2021-02-01 DIAGNOSIS — Z87891 Personal history of nicotine dependence: Secondary | ICD-10-CM | POA: Insufficient documentation

## 2021-02-01 DIAGNOSIS — R102 Pelvic and perineal pain: Secondary | ICD-10-CM | POA: Insufficient documentation

## 2021-02-01 DIAGNOSIS — M545 Low back pain, unspecified: Secondary | ICD-10-CM | POA: Insufficient documentation

## 2021-02-01 DIAGNOSIS — M5459 Other low back pain: Secondary | ICD-10-CM | POA: Diagnosis not present

## 2021-02-02 ENCOUNTER — Emergency Department (HOSPITAL_COMMUNITY)
Admission: EM | Admit: 2021-02-02 | Discharge: 2021-02-02 | Disposition: A | Discharge status: Home / Self Care | Payer: Federal, State, Local not specified - PPO | Attending: Emergency Medicine | Admitting: Emergency Medicine

## 2021-02-02 ENCOUNTER — Other Ambulatory Visit: Payer: Self-pay

## 2021-02-02 DIAGNOSIS — M545 Low back pain, unspecified: Secondary | ICD-10-CM

## 2021-02-02 LAB — URINALYSIS, ROUTINE W REFLEX MICROSCOPIC
Bilirubin Urine: NEGATIVE
Glucose, UA: NEGATIVE mg/dL
Ketones, ur: NEGATIVE mg/dL
Nitrite: NEGATIVE
Protein, ur: NEGATIVE mg/dL
Specific Gravity, Urine: >1.03 — ABNORMAL HIGH (ref 1.005–1.030)
pH: 5.5 (ref 5.0–8.0)

## 2021-02-02 LAB — URINALYSIS, MICROSCOPIC (REFLEX)

## 2021-02-02 LAB — I-STAT BETA HCG BLOOD, ED (MC, WL, AP ONLY): I-stat hCG, quantitative: <5 m[IU]/mL (ref <5)

## 2021-02-02 MED ORDER — ACETAMINOPHEN 500 MG PO TABS
1000.0000 mg | ORAL_TABLET | Freq: Once | ORAL | Status: AC
  Administered 2021-02-02: 10:00:00 1000 mg via ORAL
  Filled 2021-02-02: qty 2

## 2021-02-02 MED ORDER — KETOROLAC TROMETHAMINE 15 MG/ML IJ SOLN
15.0000 mg | Freq: Once | INTRAMUSCULAR | Status: AC
  Administered 2021-02-02: 10:00:00 15 mg via INTRAMUSCULAR
  Filled 2021-02-02: qty 1

## 2021-02-02 MED ORDER — OXYCODONE HCL 5 MG PO TABS
5.0000 mg | ORAL_TABLET | Freq: Once | ORAL | Status: AC
  Administered 2021-02-02: 10:00:00 5 mg via ORAL
  Filled 2021-02-02: qty 1

## 2021-02-02 MED ORDER — DIAZEPAM 5 MG PO TABS
5.0000 mg | ORAL_TABLET | Freq: Once | ORAL | Status: AC
  Administered 2021-02-02: 10:00:00 5 mg via ORAL
  Filled 2021-02-02: qty 1

## 2021-02-02 NOTE — Discharge Instructions (Signed)

## 2021-02-02 NOTE — ED Triage Notes (Signed)
Pt c/o lower back pain since earlier today. Pt reports Saturday she was hurting all over.

## 2021-02-02 NOTE — ED Provider Notes (Signed)
[facility] EMERGENCY DEPARTMENT Provider Note   CSN: [id number] Arrival date & time: 02/01/21  2220     History Chief Complaint  Patient presents with   Back Pain    Sandra Hawkins is a 60 y.o. female.  60 yo F with a chief complaints of right-sided low back pain.  This been going on for couple days.  She also has had a recent couple viral syndromes.  Diffuse myalgias that have improved recently.  She denies trauma to the area denies radiation of pain.  She denies loss of bowel or bladder denies well superior to sensation denies numbness or weakness to the leg.  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  No known sick contacts.  The history is provided by the patient.  Back Pain Location:  Lumbar spine Quality:  Aching Radiates to:  Does not radiate Pain severity:  Moderate Onset quality:  Gradual Duration:  2 days Timing:  Constant Progression:  Worsening Chronicity:  New Relieved by:  Nothing Worsened by:  Nothing Ineffective treatments:  None tried Associated symptoms: no chest pain, no dysuria, no fever and no headaches       Past Medical History:  Diagnosis Date   Allergy    Arthritis    Rheumatoid - knees   Fibroid 04/03/2007   H/O dysmenorrhea 2009   H/O menorrhagia 2007   H/O varicella    Hepatitis B 11/14/2006   History of measles, mumps, or rubella    History of nausea 2009   With menses    Irregular menses    Monilial vaginitis 2006   Pain pelvic    Seasonal allergies    Spasm of muscle, back    09-08-2013, therepy helped   SVD (spontaneous vaginal delivery)    x 1   Urge incontinence 2008    Patient Active Problem List   Diagnosis Date Noted   OSTEOARTHRITIS 02/26/2010   HEPATITIS B, ACUTE 12/14/2006   FIBROIDS, UTERUS - s/p TLH and TVT on 09/20/11 12/14/2006   DEGENERATIVE JOINT DISEASE, KNEE 12/14/2006    Past Surgical History:  Procedure Laterality Date    BLADDER SUSPENSION  09/20/2011   Procedure: TRANSVAGINAL TAPE (TVT) PROCEDURE;  Surgeon: Delice Lesch, MD;  Location: [facility];  Service: Gynecology;  Laterality: N/A;   BREAST BIOPSY Left    CYSTOSCOPY  09/20/2011   Procedure: CYSTOSCOPY;  Surgeon: Delice Lesch, MD;  Location: [facility];  Service: Gynecology;;   CYSTOSCOPY N/A 11/10/2012   Procedure: CYSTOSCOPY;  Surgeon: Delice Lesch, MD;  Location: [facility];  Service: Gynecology;  Laterality: N/A;   DILATION AND CURETTAGE OF UTERUS     JOINT REPLACEMENT     right knee   KNEE SURGERY     LAPAROSCOPIC HYSTERECTOMY  09/20/2011   Procedure: HYSTERECTOMY TOTAL LAPAROSCOPIC;  Surgeon: Delice Lesch, MD;  Location: [facility];  Service: Gynecology;  Laterality: N/A;   MYOMECTOMY ABDOMINAL APPROACH  04/03/2007   TRANSVAGINAL TAPE (TVT) REMOVAL N/A 11/10/2012   Procedure: TRANSVAGINAL TAPE (TVT) REMOVAL;  Surgeon: Delice Lesch, MD;  Location: [facility];  Service: Gynecology;  Laterality: N/A;  Removal of Mesh   WISDOM TOOTH EXTRACTION       OB History     Gravida  3   Para  1   Term      Preterm      AB  2   Living  1      SAB  2   IAB      Ectopic      Multiple      Live Births  1           Family History  Problem Relation Age of Onset   Cancer Father        Lung   Asthma Maternal Grandmother    Diabetes Maternal Grandmother    Diabetes Sister    Breast cancer Daughter 42   Colon cancer Neg Hx    Esophageal cancer Neg Hx    Pancreatic cancer Neg Hx    Rectal cancer Neg Hx    Stomach cancer Neg Hx     Social History   Tobacco Use   Smoking status: Former    Packs/day: 1.00    Years: 3.00    Pack years: 3.00    Types: Cigarettes    Quit date: 09/24/2013    Years since quitting: 7.3   Smokeless tobacco: Never  Substance Use Topics   Alcohol use: Yes    Comment: rsre   Drug use: No    Home Medications Prior to Admission medications   Medication Sig Start Date End Date Taking? Authorizing  Provider  Chlorphen-Pseudoephed-APAP (THERAFLU FLU/COLD PO) Take 1 packet by mouth 2 (two) times daily as needed (cold symptoms).   Yes [provider]  Misc Natural Products (ELDERBERRY ZINC/VIT C/IMMUNE MT) Use as directed 5 mLs in the mouth or throat daily as needed (immune support).   Yes [provider]  Multiple Vitamins-Minerals (EMERGEN-C IMMUNE) PACK Take 1 packet by mouth daily as needed (immune support).   Yes [provider]  famotidine (PEPCID) 20 MG tablet Take 1 tablet (20 mg total) by mouth 2 (two) times daily. Patient not taking: Reported on 05/10/2019 10/17/18   Jaynee Eagles, PA-C  HYDROcodone-acetaminophen (NORCO/VICODIN) 5-325 MG tablet Take 1 tablet by mouth every 6 (six) hours as needed for severe pain. Patient not taking: Reported on 02/02/2021 05/11/19   Antonietta Breach, PA-C  meloxicam (MOBIC) 7.5 MG tablet Take 2 tablets (15 mg total) by mouth daily. Patient not taking: Reported on 02/02/2021 05/11/19   Antonietta Breach, PA-C    Allergies    Dairy aid [tilactase], Shellfish allergy, Starch, Tramadol, and Cortisone  Review of Systems   Review of Systems  Constitutional:  Negative for chills and fever.  HENT:  Negative for congestion and rhinorrhea.   Eyes:  Negative for redness and visual disturbance.  Respiratory:  Negative for shortness of breath and wheezing.   Cardiovascular:  Negative for chest pain and palpitations.  Gastrointestinal:  Negative for nausea and vomiting.  Genitourinary:  Negative for dysuria and urgency.  Musculoskeletal:  Positive for back pain. Negative for arthralgias and myalgias.  Skin:  Negative for pallor and wound.  Neurological:  Negative for dizziness and headaches.   Physical Exam Updated Vital Signs BP 100/62    Pulse 76    Temp 99.1 F (37.3 C) (Oral)    Resp 17    Ht 5\' 7"  (1.702 m)    Wt 90.7 kg    LMP 09/13/2011    SpO2 99%    BMI 31.32 kg/m   Physical Exam Vitals and nursing note reviewed.  Constitutional:       General: She is not in acute distress.    Appearance: She is well-developed. She is not diaphoretic.  HENT:     Head: Normocephalic and atraumatic.  Eyes:  Pupils: Pupils are equal, round, and reactive to light.  Cardiovascular:     Rate and Rhythm: Normal rate and regular rhythm.     Heart sounds: No murmur heard.   No friction rub. No gallop.  Pulmonary:     Effort: Pulmonary effort is normal.     Breath sounds: No wheezing or rales.  Abdominal:     General: There is no distension.     Palpations: Abdomen is soft.     Tenderness: There is no abdominal tenderness.     Comments: Benign abdominal exam.  No CVA tenderness.  Musculoskeletal:        General: Tenderness present.     Cervical back: Normal range of motion and neck supple.     Comments: Mild right-sided paraspinal muscular tenderness also around the right SI joint.  No obvious piriformis tenderness.  Pulse motor and sensation intact to the right lower extremity.  Reflexes 2+ and equal.  Negative Babinski.  Negative straight leg raise test.  Skin:    General: Skin is warm and dry.  Neurological:     Mental Status: She is alert and oriented to person, place, and time.  Psychiatric:        Behavior: Behavior normal.    ED Results / Procedures / Treatments   Labs (all labs ordered are listed, but only abnormal results are displayed) Labs Reviewed  URINALYSIS, ROUTINE W REFLEX MICROSCOPIC - Abnormal; Notable for the following components:      Result Value   APPearance HAZY (*)    Specific Gravity, Urine >1.030 (*)    Hgb urine dipstick TRACE (*)    Leukocytes,Ua TRACE (*)    All other components within normal limits  URINALYSIS, MICROSCOPIC (REFLEX) - Abnormal; Notable for the following components:   Bacteria, UA RARE (*)    All other components within normal limits  I-STAT BETA HCG BLOOD, ED ([facility], [facility], [facility] ONLY)    EKG None  Radiology No results found.  Procedures Procedures   Medications Ordered in  ED Medications  acetaminophen (TYLENOL) tablet 1,000 mg (has no administration in time range)  oxyCODONE (Oxy IR/ROXICODONE) immediate release tablet 5 mg (has no administration in time range)  diazepam (VALIUM) tablet 5 mg (has no administration in time range)  ketorolac (TORADOL) 15 MG/ML injection 15 mg (has no administration in time range)    ED Course  I have reviewed the triage vital signs and the nursing notes.  Pertinent labs & imaging results that were available during my care of the patient were reviewed by me and considered in my medical decision making (see chart for details).    MDM Rules/Calculators/A&P                         60 yo F with a chief complaint of right sided low back pain.  Going on for couple days.  Most likely musculoskeletal by history and physical.  Benign exam.  No red flags.  Ambulates with pain.  UA without infection.  Pregnancy test negative.  Will treat pain here.  PCP follow-up.  10:03 AM:  I have discussed the diagnosis/risks/treatment options with the patient and believe the pt to be eligible for discharge home to follow-up with PCP. We also discussed returning to the ED immediately if new or worsening sx occur. We discussed the sx which are most concerning (e.g., sudden worsening pain, fever, inability to tolerate by mouth) that necessitate immediate return. Medications administered to  the patient during their visit and any new prescriptions provided to the patient are listed below.  Medications given during this visit Medications  acetaminophen (TYLENOL) tablet 1,000 mg (has no administration in time range)  oxyCODONE (Oxy IR/ROXICODONE) immediate release tablet 5 mg (has no administration in time range)  diazepam (VALIUM) tablet 5 mg (has no administration in time range)  ketorolac (TORADOL) 15 MG/ML injection 15 mg (has no administration in time range)     The patient appears reasonably screen and/or stabilized for discharge and I doubt any  other medical condition or other [facility] requiring further screening, evaluation, or treatment in the ED at this time prior to discharge.      Final Clinical Impression(s) / ED Diagnoses Final diagnoses:  Acute right-sided low back pain without sciatica    Rx / DC Orders ED Discharge Orders     None        Deno Etienne, DO 02/02/21 1003

## 2021-02-14 DIAGNOSIS — Z6831 Body mass index (BMI) 31.0-31.9, adult: Secondary | ICD-10-CM | POA: Diagnosis not present

## 2021-02-14 DIAGNOSIS — E8881 Metabolic syndrome: Secondary | ICD-10-CM | POA: Diagnosis not present

## 2021-02-14 DIAGNOSIS — R635 Abnormal weight gain: Secondary | ICD-10-CM | POA: Diagnosis not present

## 2021-02-14 DIAGNOSIS — F5081 Binge eating disorder: Secondary | ICD-10-CM | POA: Diagnosis not present

## 2021-04-24 ENCOUNTER — Ambulatory Visit: Payer: Federal, State, Local not specified - PPO | Admitting: Family Medicine

## 2021-04-24 ENCOUNTER — Encounter: Payer: Self-pay | Admitting: Family Medicine

## 2021-04-24 VITALS — BP 120/70 | HR 86 | Temp 97.5°F | Ht 67.5 in | Wt 198.5 lb

## 2021-04-24 DIAGNOSIS — Z23 Encounter for immunization: Secondary | ICD-10-CM

## 2021-04-24 DIAGNOSIS — R202 Paresthesia of skin: Secondary | ICD-10-CM | POA: Diagnosis not present

## 2021-04-24 DIAGNOSIS — Z1231 Encounter for screening mammogram for malignant neoplasm of breast: Secondary | ICD-10-CM

## 2021-04-24 DIAGNOSIS — Z114 Encounter for screening for human immunodeficiency virus [HIV]: Secondary | ICD-10-CM | POA: Diagnosis not present

## 2021-04-24 DIAGNOSIS — Z1211 Encounter for screening for malignant neoplasm of colon: Secondary | ICD-10-CM

## 2021-04-24 DIAGNOSIS — R7303 Prediabetes: Secondary | ICD-10-CM | POA: Diagnosis not present

## 2021-04-24 MED ORDER — MUPIROCIN 2 % EX OINT: 1.0000 "application " | TOPICAL_OINTMENT | Freq: Two times a day (BID) | CUTANEOUS | 0 refills | Status: DC

## 2021-04-24 NOTE — Patient Instructions (Signed)
Welcome to [facility]! It was a pleasure meeting you today. ? ?As discussed, Please schedule a 6 month follow up visit today. ? ?Take fish oil '2000mg'$ /d ?[phone] for mammogram  ? ?PLEASE NOTE: ? ?If you had any LAB tests please let us know if you have not heard back within a few days. You may see your results on MyChart before we have a chance to review them but we will give you a call once they are reviewed by [place]. If we ordered any REFERRALS today, please let us know if you have not heard from their office within the next week.  ?Let us know through MyChart if you are needing REFILLS, or have your pharmacy send [place] the request. You can also use MyChart to communicate with me or any office staff. ? ?Please try these tips to maintain a healthy lifestyle: ? ?Eat most of your calories during the day when you are active. Eliminate processed foods including packaged sweets (pies, cakes, cookies), reduce intake of potatoes, white bread, white pasta, and white rice. Look for whole grain options, oat flour or almond flour. ? ?Each meal should contain half fruits/vegetables, one quarter protein, and one quarter carbs (no bigger than a computer mouse). ? ?Cut down on sweet beverages. This includes juice, soda, and sweet tea. Also watch fruit intake, though this is a healthier sweet option, it still contains natural sugar! Limit to 3 servings daily. ? ?Drink at least 1 glass of water with each meal and aim for at least 8 glasses per day ? ?Exercise at least 150 minutes every week.   ?

## 2021-04-24 NOTE — Progress Notes (Signed)
? ?New Patient Office Visit ? ?Subjective:  ?Patient ID: Sandra Hawkins, female    DOB: [birth year]  Age: 61 y.o. MRN: [id number] ? ?CC:  ?Chief Complaint  ?Patient presents with  ? Establish Care  ? ? ?HPI ?Sandra Hawkins presents for new pt ? PreDM-not working on [employer]. Dx 1 yr ago.  Walks at work. Some urination freq. ?Some blurry vision-opt June-astigmatism.  Wears glasses ?Hands can get numb and feet can w/shoes ? ?Past Medical History:  ?Diagnosis Date  ? Allergy   ? Anxiety   ? Arthritis   ? Rheumatoid - knees  ? Diabetes mellitus without complication ([place])   ? Fibroid 04/03/2007  ? H/O dysmenorrhea 02/09/2007  ? H/O menorrhagia 02/08/2005  ? H/O varicella   ? Hepatitis B 11/14/2006  ? History of measles, mumps, or rubella   ? History of nausea 02/09/2007  ? With menses   ? Irregular menses   ? Monilial vaginitis 02/09/2004  ? Osteoporosis   ? Pain pelvic   ? Seasonal allergies   ? Spasm of muscle, back   ? 09-08-2013, therepy helped  ? SVD (spontaneous vaginal delivery)   ? x 1  ? Urge incontinence 02/08/2006  ? ? ?Past Surgical History:  ?Procedure Laterality Date  ? BLADDER SUSPENSION  09/20/2011  ? Procedure: TRANSVAGINAL TAPE (TVT) PROCEDURE;  Surgeon: Delice Lesch, MD;  Location: [facility];  Service: Gynecology;  Laterality: N/A;  ? BREAST BIOPSY Left   ? CYSTOSCOPY  09/20/2011  ? Procedure: CYSTOSCOPY;  Surgeon: Delice Lesch, MD;  Location: [facility];  Service: Gynecology;;  ? CYSTOSCOPY N/A 11/10/2012  ? Procedure: CYSTOSCOPY;  Surgeon: Delice Lesch, MD;  Location: [facility];  Service: Gynecology;  Laterality: N/A;  ? DILATION AND CURETTAGE OF UTERUS    ? JOINT REPLACEMENT    ? right knee  ? KNEE SURGERY    ? LAPAROSCOPIC HYSTERECTOMY  09/20/2011  ? Procedure: HYSTERECTOMY TOTAL LAPAROSCOPIC;  Surgeon: Delice Lesch, MD;  Location: [facility];  Service: Gynecology;  Laterality: N/A;  ? MYOMECTOMY ABDOMINAL APPROACH  04/03/2007  ? TRANSVAGINAL TAPE (TVT) REMOVAL N/A 11/10/2012  ? Procedure:  TRANSVAGINAL TAPE (TVT) REMOVAL;  Surgeon: Delice Lesch, MD;  Location: [facility];  Service: Gynecology;  Laterality: N/A;  Removal of Mesh  ? WISDOM TOOTH EXTRACTION    ? ? ?Family History  ?Problem Relation Age of Onset  ? Cancer Father   ?     Lung  ? Asthma Maternal Grandmother   ? Diabetes Maternal Grandmother   ? Diabetes Sister   ? Breast cancer Daughter 57  ? Colon cancer Neg Hx   ? Esophageal cancer Neg Hx   ? Pancreatic cancer Neg Hx   ? Rectal cancer Neg Hx   ? Stomach cancer Neg Hx   ? ? ?Social History  ? ?Socioeconomic History  ? Marital status: Single  ?  Spouse name: Not on file  ? Number of children: 1  ? Years of education: Not on file  ? Highest education level: Not on file  ?Occupational History  ? Not on file  ?Tobacco Use  ? Smoking status: Former  ?  Packs/day: 1.00  ?  Years: 3.00  ?  Pack years: 3.00  ?  Types: Cigarettes  ?  Quit date: 09/24/2013  ?  Years since quitting: 7.5  ? Smokeless tobacco: Never  ?Vaping Use  ? Vaping Use: Never used  ?Substance and Sexual Activity  ? Alcohol  use: Yes  ?  Comment: RARE  ? Drug use: Not Currently  ?  Types: Marijuana  ?  Comment: USED IN THE PAST  ? Sexual activity: Not Currently  ?  Birth control/protection: None  ?  Comment: HYST   ?Other Topics Concern  ? Not on file  ?Social History Narrative  ? Dau dec 2 yrs ago breast ca-38 Dx at 66  ? Raising grand dau-59yo  ? [employer]  ? ?Social Determinants of Health  ? ?Financial Resource Strain: Not on file  ?Food Insecurity: Not on file  ?Transportation Needs: Not on file  ?Physical Activity: Not on file  ?Stress: Not on file  ?Social Connections: Not on file  ?Intimate Partner Violence: Not on file  ? ? ?ROS  ?ROS: ?Gen: no fever, chills  ?Skin: no rash, itching ?ENT: no ear pain, ear drainage, nasal congestion, rhinorrhea, sinus pressure, sore throat.  Occ "sore" in L nostril. ?Eyes: no double vision ?Resp: no cough, wheeze,SOB ?Breast: no breast tenderness, no nipple discharge, no breast  masses ?CV: no CP, palpitations, LE edema,  occ palp-makes dizzy.  Occurs about once/mo and sob.  Lasts few sec. ?GI: no heartburn, n/v/d/c, abd pain ?GU: no dysuria, urgency, frequency, hematuria ?MSK: no joint pain, myalgias, back pain ?Neuro: no dizziness, headache, weakness, vertigo ?Psych: no depression, anxiety, insomnia, SI  ?Dry skin ? ?Objective:  ? ?Today's Vitals: BP 120/70   Pulse 86   Temp (!) 97.5 ?F (36.4 ?C) (Temporal)   Ht 5' 7.5" (1.715 m)   Wt 198 lb 8 oz (90 kg)   LMP 09/13/2011   SpO2 98%   BMI 30.63 kg/m?  ? ?Physical Exam  ?Gen: WDWN NAD OAAF ?HEENT: NCAT, conjunctiva not injected, sclera nonicteric ?TM WNL B, OP moist, no exudates  ?NECK:  supple, no thyromegaly, no nodes, no carotid bruits ?CARDIAC: RRR, S1S2+, no murmur. DP 2+B ?LUNGS: CTAB. No wheezes ?ABDOMEN:  BS+, soft, sl tender diffusely, No HSM, no masses ?EXT:  no edema ?MSK: no gross abnormalities.  ?NEURO: A&O x3.  CN II-XII intact.  ?PSYCH: normal mood. Good eye contact  ? ?Assessment & Plan:  ? ?Problem List Items Addressed This Visit   ? ?  ? Other  ? Pre-diabetes - Primary  ? Relevant Orders  ? Hemoglobin A1c  ? Lipid panel  ? TSH  ? Comprehensive metabolic panel  ? CBC with Differential/Platelet  ? Vitamin B12  ? ?Other Visit Diagnoses   ? ? Encounter for screening mammogram for malignant neoplasm of breast      ? Tingling      ? Relevant Orders  ? Hemoglobin A1c  ? TSH  ? Vitamin B12  ? Screening for HIV without presence of risk factors      ? Relevant Orders  ? MM Digital Screening  ? HIV Antibody (routine testing w rflx)  ? Screen for colon cancer      ? Relevant Orders  ? Ambulatory referral to Gastroenterology  ? Need for shingles vaccine      ? Relevant Orders  ? Varicella-zoster vaccine IM (Shingrix) (Completed)  ? ?  ? preDM-check lipids,tsh,a1c, cmp.  Work on [employer] ?Tingling-check A1C,tsh,B12. Check if certain shoes.  Wear wrist splints.   ?[facility]-ordered colon, mamm.   Shingrix #1 given ? ?Outpatient Encounter  Medications as of 04/24/2021  ?Medication Sig  ? [DISCONTINUED] mupirocin ointment (BACTROBAN) 2 % Apply 1 application. topically 2 (two) times daily.  ? mupirocin ointment (BACTROBAN) 2 %  Apply 1 application. topically 2 (two) times daily.  ? [DISCONTINUED] Chlorphen-Pseudoephed-APAP (THERAFLU FLU/COLD PO) Take 1 packet by mouth 2 (two) times daily as needed (cold symptoms). (Patient not taking: Reported on 04/24/2021)  ? [DISCONTINUED] famotidine (PEPCID) 20 MG tablet Take 1 tablet (20 mg total) by mouth 2 (two) times daily. (Patient not taking: Reported on 05/10/2019)  ? [DISCONTINUED] HYDROcodone-acetaminophen (NORCO/VICODIN) 5-325 MG tablet Take 1 tablet by mouth every 6 (six) hours as needed for severe pain. (Patient not taking: Reported on 02/02/2021)  ? [DISCONTINUED] meloxicam (MOBIC) 7.5 MG tablet Take 2 tablets (15 mg total) by mouth daily. (Patient not taking: Reported on 02/02/2021)  ? [DISCONTINUED] Misc Natural Products (ELDERBERRY ZINC/VIT C/IMMUNE MT) Use as directed 5 mLs in the mouth or throat daily as needed (immune support). (Patient not taking: Reported on 04/24/2021)  ? [DISCONTINUED] Multiple Vitamins-Minerals (EMERGEN-C IMMUNE) PACK Take 1 packet by mouth daily as needed (immune support). (Patient not taking: Reported on 04/24/2021)  ? ?No facility-administered encounter medications on file as of 04/24/2021.  ? ? ?Follow-up: Return in about 6 months (around 10/25/2021) for f/u preDM.  ? ?Wellington Hampshire, MD ?

## 2021-04-27 LAB — CBC WITH DIFFERENTIAL/PLATELET
Absolute Monocytes: 419 cells/uL (ref 200–950)
Basophils Absolute: 36 cells/uL (ref 0–200)
Basophils Relative: 0.4 %
Eosinophils Absolute: 173 cells/uL (ref 15–500)
Eosinophils Relative: 1.9 %
HCT: 38.1 % (ref 35.0–45.0)
Hemoglobin: 12.4 g/dL (ref 11.7–15.5)
Lymphs Abs: 3130 cells/uL (ref 850–3900)
MCH: 26.8 pg — ABNORMAL LOW (ref 27.0–33.0)
MCHC: 32.5 g/dL (ref 32.0–36.0)
MCV: 82.3 fL (ref 80.0–100.0)
MPV: 11.8 fL (ref 7.5–12.5)
Monocytes Relative: 4.6 %
Neutro Abs: 5342 cells/uL (ref 1500–7800)
Neutrophils Relative %: 58.7 %
Platelets: 220 10*3/uL (ref 140–400)
RBC: 4.63 10*6/uL (ref 3.80–5.10)
RDW: 14.5 % (ref 11.0–15.0)
Total Lymphocyte: 34.4 %
WBC: 9.1 10*3/uL (ref 3.8–10.8)

## 2021-04-27 LAB — COMPREHENSIVE METABOLIC PANEL
AG Ratio: 1 (calc) (ref 1.0–2.5)
ALT: 12 U/L (ref 6–29)
AST: 15 U/L (ref 10–35)
Albumin: 3.8 g/dL (ref 3.6–5.1)
Alkaline phosphatase (APISO): 113 U/L (ref 37–153)
BUN: 12 mg/dL (ref 7–25)
CO2: 19 mmol/L — ABNORMAL LOW (ref 20–32)
Calcium: 9.5 mg/dL (ref 8.6–10.4)
Chloride: 106 mmol/L (ref 98–110)
Creat: 0.98 mg/dL (ref 0.50–1.03)
Globulin: 4 g/dL (calc) — ABNORMAL HIGH (ref 1.9–3.7)
Glucose, Bld: 107 mg/dL — ABNORMAL HIGH (ref 65–99)
Potassium: 4.1 mmol/L (ref 3.5–5.3)
Sodium: 144 mmol/L (ref 135–146)
Total Bilirubin: 0.3 mg/dL (ref 0.2–1.2)
Total Protein: 7.8 g/dL (ref 6.1–8.1)

## 2021-04-27 LAB — LIPID PANEL
Cholesterol: 159 mg/dL (ref <200)
HDL: 60 mg/dL (ref >=50)
LDL Cholesterol (Calc): 82 mg/dL (calc)
Non-HDL Cholesterol (Calc): 99 mg/dL (calc) (ref <130)
Total CHOL/HDL Ratio: 2.7 (calc) (ref <5.0)
Triglycerides: 85 mg/dL (ref <150)

## 2021-04-27 LAB — HIV ANTIBODY (ROUTINE TESTING W REFLEX): HIV 1&2 Ab, 4th Generation: NONREACTIVE

## 2021-04-27 LAB — HEMOGLOBIN A1C
Hgb A1c MFr Bld: 6 % of total Hgb — ABNORMAL HIGH (ref <5.7)
Mean Plasma Glucose: 126 mg/dL
eAG (mmol/L): 7 mmol/L

## 2021-04-27 LAB — TSH: TSH: 1.61 mIU/L (ref 0.40–4.50)

## 2021-04-27 LAB — VITAMIN B12: Vitamin B-12: 446 pg/mL (ref 200–1100)

## 2021-05-18 ENCOUNTER — Other Ambulatory Visit: Payer: Self-pay | Admitting: Family Medicine

## 2021-05-18 DIAGNOSIS — N6489 Other specified disorders of breast: Secondary | ICD-10-CM

## 2021-06-19 ENCOUNTER — Ambulatory Visit
Admission: RE | Admit: 2021-06-19 | Discharge: 2021-06-19 | Disposition: A | Discharge status: Home / Self Care | Payer: Federal, State, Local not specified - PPO | Source: Ambulatory Visit | Attending: Family Medicine | Admitting: Family Medicine

## 2021-06-19 DIAGNOSIS — R928 Other abnormal and inconclusive findings on diagnostic imaging of breast: Secondary | ICD-10-CM | POA: Diagnosis not present

## 2021-06-19 DIAGNOSIS — N6489 Other specified disorders of breast: Secondary | ICD-10-CM

## 2021-06-19 LAB — HM MAMMOGRAPHY

## 2021-06-22 ENCOUNTER — Encounter: Payer: Self-pay | Admitting: Family Medicine

## 2021-07-15 ENCOUNTER — Ambulatory Visit (AMBULATORY_SURGERY_CENTER): Payer: Federal, State, Local not specified - PPO | Admitting: *Deleted

## 2021-07-15 ENCOUNTER — Other Ambulatory Visit: Payer: Self-pay

## 2021-07-15 VITALS — Ht 67.5 in | Wt 197.0 lb

## 2021-07-15 DIAGNOSIS — Z860100 Personal history of colon polyps, unspecified: Secondary | ICD-10-CM

## 2021-07-15 MED ORDER — NA SULFATE-K SULFATE-MG SULF 17.5-3.13-1.6 GM/177ML PO SOLN: 1.0000 | Freq: Once | ORAL | 0 refills | Status: AC

## 2021-07-15 NOTE — Progress Notes (Signed)
Virtual pre visit completed over telephone.  Instructions mailed and forwarded through [facility].   No egg or soy allergy known to patient  No issues known to pt with past sedation with any surgeries or procedures Patient denies ever being told they had issues or difficulty with intubation  No FH of Malignant Hyperthermia Pt is not on diet pills Pt is not on  home 02  Pt is not on blood thinners  Pt denies issues with constipation  No A fib or A flutter  Discussed with pt there will be an out-of-pocket cost for prep and that varies from $0 to 70 +  dollars - pt verbalized understanding  Pt instructed to use Singlecare.com or GoodRx for a price reduction on prep   PV completed over the phone. Pt verified name, DOB, address and insurance during PV today.  Pt mailed instruction packet with copy of consent form to read and not return, and instructions.  Pt encouraged to call with questions or issues.  If pt has My chart, procedure instructions sent via My Chart  Insurance confirmed with pt at [facility] today

## 2021-07-30 ENCOUNTER — Encounter: Payer: Self-pay | Admitting: Gastroenterology

## 2021-08-05 ENCOUNTER — Encounter: Payer: Self-pay | Admitting: Gastroenterology

## 2021-08-05 ENCOUNTER — Ambulatory Visit (AMBULATORY_SURGERY_CENTER): Payer: Federal, State, Local not specified - PPO | Admitting: Gastroenterology

## 2021-08-05 VITALS — BP 106/64 | HR 64 | Temp 96.2°F | Resp 12 | Ht 67.5 in | Wt 197.0 lb

## 2021-08-05 DIAGNOSIS — Z09 Encounter for follow-up examination after completed treatment for conditions other than malignant neoplasm: Secondary | ICD-10-CM

## 2021-08-05 DIAGNOSIS — Z1211 Encounter for screening for malignant neoplasm of colon: Secondary | ICD-10-CM | POA: Diagnosis not present

## 2021-08-05 DIAGNOSIS — D120 Benign neoplasm of cecum: Secondary | ICD-10-CM

## 2021-08-05 DIAGNOSIS — Z86010 Personal history of colonic polyps: Secondary | ICD-10-CM | POA: Diagnosis not present

## 2021-08-05 MED ORDER — SODIUM CHLORIDE 0.9 % IV SOLN: 500.0000 mL | Freq: Once | INTRAVENOUS | Status: DC

## 2021-08-05 NOTE — Patient Instructions (Signed)
YOU HAD AN ENDOSCOPIC PROCEDURE TODAY AT THE [facility]:   Refer to the procedure report that was given to you for any specific questions about what was found during the examination.  If the procedure report does not answer your questions, please call your gastroenterologist to clarify.  If you requested that your care partner not be given the details of your procedure findings, then the procedure report has been included in a sealed envelope for you to review at your convenience later.  YOU SHOULD EXPECT: Some feelings of bloating in the abdomen. Passage of more gas than usual.  Walking can help get rid of the air that was put into your GI tract during the procedure and reduce the bloating. If you had a lower endoscopy (such as a colonoscopy or flexible sigmoidoscopy) you may notice spotting of blood in your stool or on the toilet paper. If you underwent a bowel prep for your procedure, you may not have a normal bowel movement for a few days.  Please Note:  You might notice some irritation and congestion in your nose or some drainage.  This is from the oxygen used during your procedure.  There is no need for concern and it should clear up in a day or so.  SYMPTOMS TO REPORT IMMEDIATELY:  Following lower endoscopy (colonoscopy or flexible sigmoidoscopy):  Excessive amounts of blood in the stool  Significant tenderness or worsening of abdominal pains  Swelling of the abdomen that is new, acute  Fever of 100F or higher  Following upper endoscopy (EGD)  Vomiting of blood or coffee ground material  New chest pain or pain under the shoulder blades  Painful or persistently difficult swallowing  New shortness of breath  Fever of 100F or higher  Black, tarry-looking stools  For urgent or emergent issues, a gastroenterologist can be reached at any hour by calling [phone]. Do not use MyChart messaging for urgent concerns.    DIET:  We do recommend a small meal at first, but  then you may proceed to your regular diet.  Drink plenty of fluids but you should avoid alcoholic beverages for 24 hours.  ACTIVITY:  You should plan to take it easy for the rest of today and you should NOT DRIVE or use heavy machinery until tomorrow (because of the sedation medicines used during the test).    FOLLOW UP: Our staff will call the number listed on your records the next business day following your procedure.  We will call around 7:15- 8:00 am to check on you and address any questions or concerns that you may have regarding the information given to you following your procedure. If we do not reach you, we will leave a message.  If you develop any symptoms (ie: fever, flu-like symptoms, shortness of breath, cough etc.) before then, please call [phone].  If you test positive for Covid 19 in the 2 weeks post procedure, please call and report this information to us.    If any biopsies were taken you will be contacted by phone or by letter within the next 1-3 weeks.  Please call us at [phone] if you have not heard about the biopsies in 3 weeks.    SIGNATURES/CONFIDENTIALITY: You and/or your care partner have signed paperwork which will be entered into your electronic medical record.  These signatures attest to the fact that that the information above on your After Visit Summary has been reviewed and is understood.  Full responsibility of the confidentiality   of this discharge information lies with you and/or your care-partner.  

## 2021-08-05 NOTE — Progress Notes (Signed)
Report to pacu rn; vss. Care resumed by rn. 

## 2021-08-05 NOTE — Progress Notes (Signed)
History & Physical  Primary Care Physician:  Tawnya Crook, MD Primary Gastroenterologist: Lucio Edward, MD  CHIEF COMPLAINT:  Personal history of colon polyps   HPI: Sandra Hawkins is a 61 y.o. female with a personal history of adenomatous colon polyps for surveillance colonoscopy.   Past Medical History:  Diagnosis Date   Allergy    Anxiety    Arthritis    Rheumatoid - knees   Diabetes mellitus without complication ([place])    Fibroid 04/03/2007   H/O dysmenorrhea 02/09/2007   H/O menorrhagia 02/08/2005   H/O varicella    Hepatitis B 11/14/2006   History of measles, mumps, or rubella    History of nausea 02/09/2007   With menses    Irregular menses    Monilial vaginitis 02/09/2004   Osteoporosis    Pain pelvic    Seasonal allergies    Spasm of muscle, back    09-08-2013, therepy helped   SVD (spontaneous vaginal delivery)    x 1   Urge incontinence 02/08/2006    Past Surgical History:  Procedure Laterality Date   BLADDER SUSPENSION  09/20/2011   Procedure: TRANSVAGINAL TAPE (TVT) PROCEDURE;  Surgeon: Delice Lesch, MD;  Location: [facility];  Service: Gynecology;  Laterality: N/A;   BREAST BIOPSY Left    CYSTOSCOPY  09/20/2011   Procedure: CYSTOSCOPY;  Surgeon: Delice Lesch, MD;  Location: [facility];  Service: Gynecology;;   CYSTOSCOPY N/A 11/10/2012   Procedure: CYSTOSCOPY;  Surgeon: Delice Lesch, MD;  Location: [facility];  Service: Gynecology;  Laterality: N/A;   DILATION AND CURETTAGE OF UTERUS     JOINT REPLACEMENT     right knee   KNEE SURGERY     LAPAROSCOPIC HYSTERECTOMY  09/20/2011   Procedure: HYSTERECTOMY TOTAL LAPAROSCOPIC;  Surgeon: Delice Lesch, MD;  Location: [facility];  Service: Gynecology;  Laterality: N/A;   MYOMECTOMY ABDOMINAL APPROACH  04/03/2007   TRANSVAGINAL TAPE (TVT) REMOVAL N/A 11/10/2012   Procedure: TRANSVAGINAL TAPE (TVT) REMOVAL;  Surgeon: Delice Lesch, MD;  Location: [facility];  Service: Gynecology;  Laterality: N/A;   Removal of Mesh   WISDOM TOOTH EXTRACTION      Prior to Admission medications   Medication Sig Start Date End Date Taking? Authorizing Provider  loratadine (CLARITIN) 10 MG tablet Take 10 mg by mouth daily.    [provider]  mupirocin ointment (BACTROBAN) 2 % Apply 1 application. topically 2 (two) times daily. 04/24/21   Tawnya Crook, MD    Current Outpatient Medications  Medication Sig Dispense Refill   loratadine (CLARITIN) 10 MG tablet Take 10 mg by mouth daily.     mupirocin ointment (BACTROBAN) 2 % Apply 1 application. topically 2 (two) times daily. 22 g 0   Current Facility-Administered Medications  Medication Dose Route Frequency Provider Last Rate Last Admin   0.9 %  sodium chloride infusion  500 mL Intravenous Once Ladene Artist, MD        Allergies as of 08/05/2021 - Review Complete 08/05/2021  Allergen Reaction Noted   Dairy aid [tilactase] Other (See Comments) 06/06/2000   Shellfish allergy Hives 06/06/2000   Starch Other (See Comments) 06/06/2000   Tramadol Itching 12/05/2013   Cortisone Rash 08/30/2011    Family History  Problem Relation Age of Onset   Cancer Father        Lung   Diabetes Sister    Asthma Maternal Grandmother    Diabetes Maternal Grandmother    Breast  cancer Daughter 26   Colon cancer Neg Hx    Esophageal cancer Neg Hx    Pancreatic cancer Neg Hx    Rectal cancer Neg Hx    Stomach cancer Neg Hx    Colitis Neg Hx     Social History   Socioeconomic History   Marital status: Single    Spouse name: Not on file   Number of children: 1   Years of education: Not on file   Highest education level: Not on file  Occupational History   Not on file  Tobacco Use   Smoking status: Former    Packs/day: 1.00    Years: 3.00    Total pack years: 3.00    Types: Cigarettes    Quit date: 09/24/2013    Years since quitting: 7.8   Smokeless tobacco: Never  Vaping Use   Vaping Use: Never used  Substance and Sexual Activity    Alcohol use: Yes    Comment: RARE   Drug use: Not Currently    Types: Marijuana    Comment: USED IN THE PAST   Sexual activity: Not Currently    Birth control/protection: None    Comment: HYST   Other Topics Concern   Not on file  Social History Narrative   Dau dec 2 yrs ago breast ca-40 Dx at 61   Raising grand dau-59yo   [employer]   Social Determinants of Health   Financial Resource Strain: Not on file  Food Insecurity: Not on file  Transportation Needs: Not on file  Physical Activity: Not on file  Stress: Not on file  Social Connections: Not on file  Intimate Partner Violence: Not on file    Review of Systems:  All systems reviewed an negative except where noted in HPI.  Gen: Denies any fever, chills, sweats, anorexia, fatigue, weakness, malaise, weight loss, and sleep disorder CV: Denies chest pain, angina, palpitations, syncope, orthopnea, PND, peripheral edema, and claudication. Resp: Denies dyspnea at rest, dyspnea with exercise, cough, sputum, wheezing, coughing up blood, and pleurisy. GI: Denies vomiting blood, jaundice, and fecal incontinence.   Denies dysphagia or odynophagia. GU : Denies urinary burning, blood in urine, urinary frequency, urinary hesitancy, nocturnal urination, and urinary incontinence. MS: Denies joint pain, limitation of movement, and swelling, stiffness, low back pain, extremity pain. Denies muscle weakness, cramps, atrophy.  Derm: Denies rash, itching, dry skin, hives, moles, warts, or unhealing ulcers.  Psych: Denies depression, anxiety, memory loss, suicidal ideation, hallucinations, paranoia, and confusion. Heme: Denies bruising, bleeding, and enlarged lymph nodes. Neuro:  Denies any headaches, dizziness, paresthesias. Endo:  Denies any problems with DM, thyroid, adrenal function.   Physical Exam: General:  Alert, well-developed, in NAD Head:  Normocephalic and atraumatic. Eyes:  Sclera clear, no icterus.   Conjunctiva  pink. Ears:  Normal auditory acuity. Mouth:  No deformity or lesions.  Neck:  Supple; no masses . Lungs:  Clear throughout to auscultation.   No wheezes, crackles, or rhonchi. No acute distress. Heart:  Regular rate and rhythm; no murmurs. Abdomen:  Soft, nondistended, nontender. No masses, hepatomegaly. No obvious masses.  Normal bowel .    Rectal:  Deferred   Msk:  Symmetrical without gross deformities.. Pulses:  Normal pulses noted. Extremities:  Without edema. Neurologic:  Alert and  oriented x4;  grossly normal neurologically. Skin:  Intact without significant lesions or rashes. Cervical Nodes:  No significant cervical adenopathy. Psych:  Alert and cooperative. Normal mood and affect.  Impression / Plan:  Personal history of adenomatous colon polyps for surveillance colonoscopy.  Pricilla Riffle. Fuller Plan  08/05/2021, 2:30 PM See Shea Evans, [facility], to contact our on call provider

## 2021-08-05 NOTE — Progress Notes (Signed)
Pt's states no medical or surgical changes since previsit or office visit. 

## 2021-08-05 NOTE — Progress Notes (Signed)
Called to room to assist during endoscopic procedure.  Patient ID and intended procedure confirmed with present staff. Received instructions for my participation in the procedure from the performing physician.  

## 2021-08-05 NOTE — Op Note (Signed)
[facility] Patient Name: Sandra Hawkins Procedure Date: 08/05/2021 2:26 PM MRN: [id number] Endoscopist: Ladene Artist , MD Age: 61 Referring MD:  Date of Birth: [birth year] Gender: Female Account #: [id number] Procedure:                Colonoscopy Indications:              Surveillance: Personal history of adenomatous                            polyps on last colonoscopy > 5 years ago Medicines:                Monitored Anesthesia Care Procedure:                Pre-Anesthesia Assessment:                           - Prior to the procedure, a History and Physical                            was performed, and patient medications and                            allergies were reviewed. The patient's tolerance of                            previous anesthesia was also reviewed. The risks                            and benefits of the procedure and the sedation                            options and risks were discussed with the patient.                            All questions were answered, and informed consent                            was obtained. Prior Anticoagulants: The patient has                            taken no previous anticoagulant or antiplatelet                            agents. ASA Grade Assessment: II - A patient with                            mild systemic disease. After reviewing the risks                            and benefits, the patient was deemed in                            satisfactory condition to undergo the procedure.  After obtaining informed consent, the colonoscope                            was passed under direct vision. Throughout the                            procedure, the patient's blood pressure, pulse, and                            oxygen saturations were monitored continuously. The                            Olympus PCF-[id number] (#[id number]) Colonoscope was                            introduced through the  anus and advanced to the the                            cecum, identified by appendiceal orifice and                            ileocecal valve. The ileocecal valve, appendiceal                            orifice, and rectum were photographed. The quality                            of the bowel preparation was adequate after                            extensive lavage, suction. The colonoscopy was                            performed without difficulty. The patient tolerated                            the procedure well. Scope In: 2:40:09 PM Scope Out: 2:54:37 PM Scope Withdrawal Time: 0 hours 12 minutes 33 seconds  Total Procedure Duration: 0 hours 14 minutes 28 seconds  Findings:                 The perianal and digital rectal examinations were                            normal.                           A 5 mm polyp was found in the cecum. The polyp was                            sessile. The polyp was removed with a cold snare.                            Resection and retrieval were complete.  The exam was otherwise without abnormality on                            direct and retroflexion views. Complications:            No immediate complications. Estimated blood loss:                            None. Estimated Blood Loss:     Estimated blood loss: none. Impression:               - One 5 mm polyp in the cecum, removed with a cold                            snare. Resected and retrieved.                           - The examination was otherwise normal on direct                            and retroflexion views. Recommendation:           - Repeat colonoscopy after studies are complete for                            surveillance based on pathology results with a more                            extensive bowel prep.                           - Patient has a contact number available for                            emergencies. The signs and symptoms of potential                             delayed complications were discussed with the                            patient. Return to normal activities tomorrow.                            Written discharge instructions were provided to the                            patient.                           - Resume previous diet.                           - Continue present medications.                           - Await pathology results. Ladene Artist, MD 08/05/2021 2:58:03 PM This report has been signed  electronically.

## 2021-08-06 ENCOUNTER — Telehealth: Payer: Self-pay

## 2021-08-06 NOTE — Telephone Encounter (Signed)
  Follow up Call-     08/05/2021    1:44 PM  Call back number  Post procedure Call Back phone  # [phone]  Permission to leave phone message Yes     Patient questions:  Do you have a fever, pain , or abdominal swelling? No. Pain Score  0 *  Have you tolerated food without any problems? Yes.    Have you been able to return to your normal activities? Yes.    Do you have any questions about your discharge instructions: Diet   No. Medications  No. Follow up visit  No.  Do you have questions or concerns about your Care? No.  Actions: * If pain score is 4 or above: No action needed, pain <4.

## 2021-08-20 ENCOUNTER — Encounter: Payer: Self-pay | Admitting: Gastroenterology

## 2021-10-26 ENCOUNTER — Encounter: Payer: Self-pay | Admitting: Family Medicine

## 2021-10-26 ENCOUNTER — Ambulatory Visit: Payer: Federal, State, Local not specified - PPO | Admitting: Family Medicine

## 2021-10-26 VITALS — BP 110/72 | HR 72 | Temp 97.8°F | Ht 67.5 in | Wt 200.1 lb

## 2021-10-26 DIAGNOSIS — Z6830 Body mass index (BMI) 30.0-30.9, adult: Secondary | ICD-10-CM

## 2021-10-26 DIAGNOSIS — Z23 Encounter for immunization: Secondary | ICD-10-CM | POA: Diagnosis not present

## 2021-10-26 DIAGNOSIS — E6609 Other obesity due to excess calories: Secondary | ICD-10-CM | POA: Diagnosis not present

## 2021-10-26 DIAGNOSIS — R7303 Prediabetes: Secondary | ICD-10-CM

## 2021-10-26 MED ORDER — WEGOVY 0.5 MG/0.5ML ~~LOC~~ SOAJ: 0.5000 mg | SUBCUTANEOUS | 0 refills | Status: DC

## 2021-10-26 NOTE — Patient Instructions (Signed)
It was very nice to see you today!  If wegovy not covered, will do Phentermine   PLEASE NOTE:  If you had any lab tests please let us know if you have not heard back within a few days. You may see your results on MyChart before we have a chance to review them but we will give you a call once they are reviewed by [place]. If we ordered any referrals today, please let us know if you have not heard from their office within the next week.   Please try these tips to maintain a healthy lifestyle:  Eat most of your calories during the day when you are active. Eliminate processed foods including packaged sweets (pies, cakes, cookies), reduce intake of potatoes, white bread, white pasta, and white rice. Look for whole grain options, oat flour or almond flour.  Each meal should contain half fruits/vegetables, one quarter protein, and one quarter carbs (no bigger than a computer mouse).  Cut down on sweet beverages. This includes juice, soda, and sweet tea. Also watch fruit intake, though this is a healthier sweet option, it still contains natural sugar! Limit to 3 servings daily.  Drink at least 1 glass of water with each meal and aim for at least 8 glasses per day  Exercise at least 150 minutes every week.

## 2021-10-26 NOTE — Progress Notes (Signed)
Subjective:     Patient ID: Sandra Hawkins, female    DOB: [birth year], 61 y.o.   MRN: [id number]  Chief Complaint  Patient presents with   Follow-up    6 month follow-up on pre dm    HPI  Pre DM-working on diet some.  Always hungry and has to have something in mouth.  Raising granddaughter.  Not exercising.   There are no preventive care reminders to display for this patient.   Past Medical History:  Diagnosis Date   Allergy    Anxiety    Arthritis    Rheumatoid - knees   Diabetes mellitus without complication ([place])    Fibroid 04/03/2007   H/O dysmenorrhea 02/09/2007   H/O menorrhagia 02/08/2005   H/O varicella    Hepatitis B 11/14/2006   History of measles, mumps, or rubella    History of nausea 02/09/2007   With menses    Irregular menses    Monilial vaginitis 02/09/2004   Osteoporosis    Pain pelvic    Seasonal allergies    Spasm of muscle, back    09-08-2013, therepy helped   SVD (spontaneous vaginal delivery)    x 1   Urge incontinence 02/08/2006    Past Surgical History:  Procedure Laterality Date   BLADDER SUSPENSION  09/20/2011   Procedure: TRANSVAGINAL TAPE (TVT) PROCEDURE;  Surgeon: Delice Lesch, MD;  Location: [facility];  Service: Gynecology;  Laterality: N/A;   BREAST BIOPSY Left    CYSTOSCOPY  09/20/2011   Procedure: CYSTOSCOPY;  Surgeon: Delice Lesch, MD;  Location: [facility];  Service: Gynecology;;   CYSTOSCOPY N/A 11/10/2012   Procedure: CYSTOSCOPY;  Surgeon: Delice Lesch, MD;  Location: [facility];  Service: Gynecology;  Laterality: N/A;   DILATION AND CURETTAGE OF UTERUS     JOINT REPLACEMENT     right knee   KNEE SURGERY     LAPAROSCOPIC HYSTERECTOMY  09/20/2011   Procedure: HYSTERECTOMY TOTAL LAPAROSCOPIC;  Surgeon: Delice Lesch, MD;  Location: [facility];  Service: Gynecology;  Laterality: N/A;   MYOMECTOMY ABDOMINAL APPROACH  04/03/2007   TRANSVAGINAL TAPE (TVT) REMOVAL N/A 11/10/2012   Procedure: TRANSVAGINAL TAPE (TVT) REMOVAL;   Surgeon: Delice Lesch, MD;  Location: [facility];  Service: Gynecology;  Laterality: N/A;  Removal of Mesh   WISDOM TOOTH EXTRACTION      Outpatient Medications Prior to Visit  Medication Sig Dispense Refill   loratadine (CLARITIN) 10 MG tablet Take 10 mg by mouth daily.     mupirocin ointment (BACTROBAN) 2 % Apply 1 application. topically 2 (two) times daily. 22 g 0   No facility-administered medications prior to visit.    Allergies  Allergen Reactions   Cortizone-10 Feminine Itch [Hydrocortisone] Other (See Comments)   Dairy Aid [Tilactase] Other (See Comments)    bloating   Hetastarch Other (See Comments)   Lactose Other (See Comments)   Shellfish Allergy Hives and Other (See Comments)   Starch Other (See Comments)    bloating   Tramadol Itching   Cortisone Rash   ROS neg/noncontributory except as noted HPI/below No cp.  Some DOE-heavy lifting, etc.   No SI.       Objective:     BP 110/72   Pulse 72   Temp 97.8 F (36.6 C) (Temporal)   Ht 5' 7.5" (1.715 m)   Wt 200 lb 2 oz (90.8 kg)   LMP 09/13/2011   SpO2 96%   BMI 30.88 kg/m  Wt Readings from Last 3 Encounters:  10/26/21 200 lb 2 oz (90.8 kg)  08/05/21 197 lb (89.4 kg)  07/15/21 197 lb (89.4 kg)    Physical Exam   Gen: WDWN NAD HEENT: NCAT, conjunctiva not injected, sclera nonicteric NECK:  supple, no thyromegaly, no nodes, no carotid bruits CARDIAC: RRR, S1S2+, no murmur. DP 2+B LUNGS: CTAB. No wheezes ABDOMEN:  BS+, soft, NTND, No HSM, no masses EXT:  no edema MSK: no gross abnormalities.  NEURO: A&O x3.  CN II-XII intact.  PSYCH: normal mood. Good eye contact     Assessment & Plan:   Problem List Items Addressed This Visit       Other   Pre-diabetes - Primary   Relevant Medications   Semaglutide-Weight Management (WEGOVY) 0.5 MG/0.5ML SOAJ   Other Relevant Orders   Comprehensive metabolic panel   Hemoglobin A1c   Other Visit Diagnoses     Need for prophylactic vaccination against  single bacterial disease       Need for shingles vaccine       Relevant Orders   Zoster Recombinant (Shingrix ) (Completed)   Class 1 obesity due to excess calories with serious comorbidity and body mass index (BMI) of 30.0 to 30.9 in adult       Relevant Medications   Semaglutide-Weight Management (WEGOVY) 0.5 MG/0.5ML SOAJ      PreDM-needs to work harder on diet/exercise.  Check A1C, CMP.  Wegovy 0.'5mg'$  weekly Obesity-work on diet/exercise-wegovy 0.'5mg'$ .  call monthly for dose increase.  If not covered, Phentermine and f/u 1 mo.  SED.   Meds ordered this encounter  Medications   Semaglutide-Weight Management (WEGOVY) 0.5 MG/0.5ML SOAJ    Sig: Inject 0.5 mg into the skin once a week.    Dispense:  2 mL    Refill:  0    Wellington Hampshire, MD

## 2021-10-27 LAB — COMPREHENSIVE METABOLIC PANEL
ALT: 14 U/L (ref 0–35)
AST: 16 U/L (ref 0–37)
Albumin: 3.7 g/dL (ref 3.5–5.2)
Alkaline Phosphatase: 103 U/L (ref 39–117)
BUN: 15 mg/dL (ref 6–23)
CO2: 29 mEq/L (ref 19–32)
Calcium: 9.3 mg/dL (ref 8.4–10.5)
Chloride: 105 mEq/L (ref 96–112)
Creatinine, Ser: 1 mg/dL (ref 0.40–1.20)
GFR: 61.69 mL/min (ref >60.00)
Glucose, Bld: 86 mg/dL (ref 70–99)
Potassium: 4.1 mEq/L (ref 3.5–5.1)
Sodium: 141 mEq/L (ref 135–145)
Total Bilirubin: 0.6 mg/dL (ref 0.2–1.2)
Total Protein: 7.8 g/dL (ref 6.0–8.3)

## 2021-10-27 LAB — HEMOGLOBIN A1C: Hgb A1c MFr Bld: 6.5 % (ref 4.6–6.5)

## 2021-10-27 NOTE — Progress Notes (Signed)
Technically diabetic now-was Wegovy covered?   Continue as we discussed and will f/u in 3 months

## 2021-10-29 ENCOUNTER — Other Ambulatory Visit: Payer: Self-pay | Admitting: Family Medicine

## 2021-10-29 MED ORDER — SEMAGLUTIDE(0.25 OR 0.5MG/DOS) 2 MG/3ML ~~LOC~~ SOPN: 0.2500 mg | PEN_INJECTOR | SUBCUTANEOUS | 0 refills | Status: DC

## 2021-10-29 NOTE — Progress Notes (Signed)
Since now DM-sending ozempic

## 2021-12-03 ENCOUNTER — Other Ambulatory Visit: Payer: Self-pay

## 2021-12-03 ENCOUNTER — Encounter: Payer: Self-pay | Admitting: Family Medicine

## 2021-12-03 DIAGNOSIS — E6609 Other obesity due to excess calories: Secondary | ICD-10-CM

## 2021-12-03 DIAGNOSIS — R7303 Prediabetes: Secondary | ICD-10-CM

## 2021-12-03 MED ORDER — SEMAGLUTIDE(0.25 OR 0.5MG/DOS) 2 MG/3ML ~~LOC~~ SOPN: 0.2500 mg | PEN_INJECTOR | SUBCUTANEOUS | 0 refills | Status: DC

## 2021-12-26 ENCOUNTER — Encounter (HOSPITAL_COMMUNITY): Payer: Self-pay

## 2021-12-26 ENCOUNTER — Emergency Department (HOSPITAL_COMMUNITY)
Admission: EM | Admit: 2021-12-26 | Discharge: 2021-12-26 | Disposition: A | Discharge status: Home / Self Care | Payer: Federal, State, Local not specified - PPO | Attending: Emergency Medicine | Admitting: Emergency Medicine

## 2021-12-26 ENCOUNTER — Other Ambulatory Visit: Payer: Self-pay

## 2021-12-26 DIAGNOSIS — X58XXXA Exposure to other specified factors, initial encounter: Secondary | ICD-10-CM | POA: Insufficient documentation

## 2021-12-26 DIAGNOSIS — S0501XA Injury of conjunctiva and corneal abrasion without foreign body, right eye, initial encounter: Secondary | ICD-10-CM | POA: Insufficient documentation

## 2021-12-26 DIAGNOSIS — E119 Type 2 diabetes mellitus without complications: Secondary | ICD-10-CM | POA: Diagnosis not present

## 2021-12-26 DIAGNOSIS — S0591XA Unspecified injury of right eye and orbit, initial encounter: Secondary | ICD-10-CM | POA: Diagnosis not present

## 2021-12-26 MED ORDER — TETRACAINE HCL 0.5 % OP SOLN
2.0000 [drp] | Freq: Once | OPHTHALMIC | Status: AC
  Administered 2021-12-26: 2 [drp] via OPHTHALMIC
  Filled 2021-12-26: qty 4

## 2021-12-26 MED ORDER — FLUORESCEIN SODIUM 1 MG OP STRP
1.0000 | ORAL_STRIP | Freq: Once | OPHTHALMIC | Status: AC
  Administered 2021-12-26: 1 via OPHTHALMIC
  Filled 2021-12-26: qty 1

## 2021-12-26 MED ORDER — OFLOXACIN 0.3 % OP SOLN: 2.0000 [drp] | Freq: Four times a day (QID) | OPHTHALMIC | 0 refills | Status: AC

## 2021-12-26 NOTE — Discharge Instructions (Signed)
Please pick up antibiotic eye drop. You can also use saline drops for comfort Call Dr. Coralyn Pear office to schedule follow up visit.

## 2021-12-26 NOTE — ED Triage Notes (Signed)
Patient complains of right eye burning that started yesterday. Thinks she had dust from leaves blown into eye. Has tried to flush with ongoing tearing. Patient in no distress

## 2021-12-26 NOTE — ED Notes (Signed)
Right eye irriagated with morgan lens and ns

## 2021-12-26 NOTE — ED Provider Notes (Signed)
[facility] Provider Note   CSN: [id number] Arrival date & time: 12/26/21  7169     History Past medical history of diabetes  No chief complaint on file.   Sandra Hawkins is a 61 y.o. female. Patient is presenting with right eye irritation.  She says she noticed this yesterday afternoon.  She said earlier yesterday afternoon she thought she may have had some dust get blown into her eye.  Tried to flush her eye out several times with clear eyes.  Has not had any relief.  She has associated blurry vision in the eye.  She feels that she is unable to open it. Clear discharge noted.  HPI     Home Medications Prior to Admission medications   Medication Sig Start Date End Date Taking? Authorizing Provider  ofloxacin (OCUFLOX) 0.3 % ophthalmic solution Place 2 drops into the right eye every 6 (six) hours for 7 days. 12/26/21 01/02/22 Yes Aaryan Essman, Adora Fridge, PA-C  loratadine (CLARITIN) 10 MG tablet Take 10 mg by mouth daily.    [provider]  Semaglutide,0.25 or 0.'5MG'$ /DOS, 2 MG/3ML SOPN Inject 0.25 mg into the skin once a week. 12/03/21   Tawnya Crook, MD      Allergies    Cortizone-10 feminine itch [hydrocortisone], Dairy aid [tilactase], Hetastarch, Lactose, Shellfish allergy, Starch, Tramadol, and Cortisone    Review of Systems   Review of Systems  Eyes:  Positive for pain, discharge, redness and visual disturbance.  All other systems reviewed and are negative.   Physical Exam Updated Vital Signs BP 127/85 (BP Location: Left Arm)   Pulse 60   Temp 97.9 F (36.6 C) (Oral)   Resp 16   LMP 09/13/2011   SpO2 100%  Physical Exam Vitals and nursing note reviewed.  Constitutional:      General: She is not in acute distress.    Appearance: Normal appearance. She is well-developed. She is not ill-appearing, toxic-appearing or diaphoretic.  HENT:     Head: Normocephalic and atraumatic.     Nose: No nasal deformity.      Mouth/Throat:     Lips: Pink. No lesions.  Eyes:     General: Lids are normal. Gaze aligned appropriately. No scleral icterus.       Right eye: No discharge.        Left eye: No discharge.     Conjunctiva/sclera:     Right eye: Right conjunctiva is injected. No exudate or hemorrhage.    Left eye: Left conjunctiva is not injected. No exudate or hemorrhage. Pulmonary:     Effort: Pulmonary effort is normal. No respiratory distress.  Skin:    General: Skin is warm and dry.  Neurological:     Mental Status: She is alert and oriented to person, place, and time.  Psychiatric:        Mood and Affect: Mood normal.        Speech: Speech normal.        Behavior: Behavior normal. Behavior is cooperative.     ED Results / Procedures / Treatments   Labs (all labs ordered are listed, but only abnormal results are displayed) Labs Reviewed - No data to display  EKG None  Radiology No results found.  Procedures Procedures   Medications Ordered in ED Medications  tetracaine (PONTOCAINE) 0.5 % ophthalmic solution 2 drop (2 drops Right Eye Given 12/26/21 1000)  fluorescein ophthalmic strip 1 strip (1 strip Right Eye Given 12/26/21 1000)  ED Course/ Medical Decision Making/ A&P                           Medical Decision Making Risk Prescription drug management.   Presenting with right-sided eye irritation.  She had known dust exposure in her eyes yesterday afternoon.  Her symptoms resolved after tetracaine application making its more likely this is superficial.  Eye exam notable for corneal abrasion versus ulcer.  Her vision is not impaired.  No eye emergency identified. I prescribed ophthalmic antibiotics.  Follow-up with ophthalmology.  Final Clinical Impression(s) / ED Diagnoses Final diagnoses:  Abrasion of right cornea, initial encounter    Rx / DC Orders ED Discharge Orders          Ordered    ofloxacin (OCUFLOX) 0.3 % ophthalmic solution  Every 6 hours         12/26/21 [place], Adora Fridge, PA-C 12/26/21 1218    Fredia Sorrow, MD 12/29/21 1542

## 2022-01-21 ENCOUNTER — Encounter: Payer: Self-pay | Admitting: Family Medicine

## 2022-01-21 ENCOUNTER — Other Ambulatory Visit: Payer: Self-pay | Admitting: Family Medicine

## 2022-01-21 ENCOUNTER — Ambulatory Visit: Payer: Federal, State, Local not specified - PPO | Admitting: Family Medicine

## 2022-01-21 VITALS — BP 100/70 | HR 66 | Temp 98.2°F | Ht 67.5 in | Wt 188.5 lb

## 2022-01-21 DIAGNOSIS — Z23 Encounter for immunization: Secondary | ICD-10-CM

## 2022-01-21 DIAGNOSIS — R7303 Prediabetes: Secondary | ICD-10-CM | POA: Diagnosis not present

## 2022-01-21 DIAGNOSIS — Z6830 Body mass index (BMI) 30.0-30.9, adult: Secondary | ICD-10-CM | POA: Diagnosis not present

## 2022-01-21 DIAGNOSIS — E6609 Other obesity due to excess calories: Secondary | ICD-10-CM

## 2022-01-21 MED ORDER — SEMAGLUTIDE(0.25 OR 0.5MG/DOS) 2 MG/3ML ~~LOC~~ SOPN: 0.5000 mg | PEN_INJECTOR | SUBCUTANEOUS | 0 refills | Status: DC

## 2022-01-21 MED ORDER — WEGOVY 0.5 MG/0.5ML ~~LOC~~ SOAJ: 0.5000 mg | SUBCUTANEOUS | 0 refills | Status: DC

## 2022-01-21 NOTE — Patient Instructions (Signed)
It was very nice to see you today!  Happy Holidays!  Call monthly for dose changes.   PLEASE NOTE:  If you had any lab tests please let us know if you have not heard back within a few days. You may see your results on MyChart before we have a chance to review them but we will give you a call once they are reviewed by [place]. If we ordered any referrals today, please let us know if you have not heard from their office within the next week.   Please try these tips to maintain a healthy lifestyle:  Eat most of your calories during the day when you are active. Eliminate processed foods including packaged sweets (pies, cakes, cookies), reduce intake of potatoes, white bread, white pasta, and white rice. Look for whole grain options, oat flour or almond flour.  Each meal should contain half fruits/vegetables, one quarter protein, and one quarter carbs (no bigger than a computer mouse).  Cut down on sweet beverages. This includes juice, soda, and sweet tea. Also watch fruit intake, though this is a healthier sweet option, it still contains natural sugar! Limit to 3 servings daily.  Drink at least 1 glass of water with each meal and aim for at least 8 glasses per day  Exercise at least 150 minutes every week.

## 2022-01-21 NOTE — Progress Notes (Signed)
Subjective:     Patient ID: Sandra Hawkins, female    DOB: January 29, 1961, 61 y.o.   MRN: [id number]  Chief Complaint  Patient presents with   Follow-up    3 month follow-up on weight Not fasting     HPI  Pre DM-working on diet some.  Raising granddaughter 59yo so some struggles w/food choices Obesity-on wegovy 0.25.  helped w/appetite but still craving sweets a lot.  Lost 12#. No nausea/diarrhea/constipation.  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   Health Maintenance Due  Topic Date Due   DTaP/Tdap/Td (2 - Tdap) 02/27/2020    Past Medical History:  Diagnosis Date   Allergy    Anxiety    Arthritis    Rheumatoid - knees   Diabetes mellitus without complication ([place])    Fibroid 04/03/2007   H/O dysmenorrhea 02/09/2007   H/O menorrhagia 02/08/2005   H/O varicella    Hepatitis B 11/14/2006   History of measles, mumps, or rubella    History of nausea 02/09/2007   With menses    Irregular menses    Monilial vaginitis 02/09/2004   Osteoporosis    Pain pelvic    Seasonal allergies    Spasm of muscle, back    09-08-2013, therepy helped   SVD (spontaneous vaginal delivery)    x 1   Urge incontinence 02/08/2006    Past Surgical History:  Procedure Laterality Date   BLADDER SUSPENSION  09/20/2011   Procedure: TRANSVAGINAL TAPE (TVT) PROCEDURE;  Surgeon: Delice Lesch, MD;  Location: [facility];  Service: Gynecology;  Laterality: N/A;   BREAST BIOPSY Left    CYSTOSCOPY  09/20/2011   Procedure: CYSTOSCOPY;  Surgeon: Delice Lesch, MD;  Location: [facility];  Service: Gynecology;;   CYSTOSCOPY N/A 11/10/2012   Procedure: CYSTOSCOPY;  Surgeon: Delice Lesch, MD;  Location: [facility];  Service: Gynecology;  Laterality: N/A;   DILATION AND CURETTAGE OF UTERUS     JOINT REPLACEMENT     right knee   KNEE SURGERY     LAPAROSCOPIC HYSTERECTOMY  09/20/2011   Procedure: HYSTERECTOMY TOTAL LAPAROSCOPIC;  Surgeon: Delice Lesch, MD;  Location: [facility];  Service: Gynecology;  Laterality:  N/A;   MYOMECTOMY ABDOMINAL APPROACH  04/03/2007   TRANSVAGINAL TAPE (TVT) REMOVAL N/A 11/10/2012   Procedure: TRANSVAGINAL TAPE (TVT) REMOVAL;  Surgeon: Delice Lesch, MD;  Location: [facility];  Service: Gynecology;  Laterality: N/A;  Removal of Mesh   WISDOM TOOTH EXTRACTION      Outpatient Medications Prior to Visit  Medication Sig Dispense Refill   Semaglutide,0.25 or 0.'5MG'$ /DOS, 2 MG/3ML SOPN Inject 0.25 mg into the skin once a week. 3 mL 0   loratadine (CLARITIN) 10 MG tablet Take 10 mg by mouth daily.     No facility-administered medications prior to visit.    Allergies  Allergen Reactions   Cortizone-10 Feminine Itch [Hydrocortisone] Other (See Comments)   Dairy Aid [Tilactase] Other (See Comments)    bloating   Hetastarch Other (See Comments)   Lactose Other (See Comments)   Shellfish Allergy Hives and Other (See Comments)   Starch Other (See Comments)    bloating   Tramadol Itching   Cortisone Rash   ROS neg/noncontributory except as noted HPI/below      Objective:     BP 100/70   Pulse 66   Temp 98.2 F (36.8 C) (Temporal)   Ht 5' 7.5" (1.715 m)   Wt 188 lb 8 oz (85.5 kg)  LMP 09/13/2011   SpO2 98%   BMI 29.09 kg/m  Wt Readings from Last 3 Encounters:  01/21/22 188 lb 8 oz (85.5 kg)  10/26/21 200 lb 2 oz (90.8 kg)  08/05/21 197 lb (89.4 kg)    Physical Exam   Gen: WDWN NAD HEENT: NCAT, conjunctiva not injected, sclera nonicteric NECK:  supple, no thyromegaly, no nodes, no carotid bruits CARDIAC: RRR, S1S2+, no murmur. DP 2+B LUNGS: CTAB. No wheezes ABDOMEN:  BS+, soft, NTND, No HSM, no masses EXT:  no edema MSK: no gross abnormalities.  NEURO: A&O x3.  CN II-XII intact.  PSYCH: normal mood. Good eye contact     Assessment & Plan:   Problem List Items Addressed This Visit       Other   Pre-diabetes - Primary   Other Visit Diagnoses     Class 1 obesity due to excess calories with serious comorbidity and body mass index (BMI) of 30.0 to  30.9 in adult          PreDM-working on diet.  Check A1C but too early today.   Obesity-has lost 12#, but still struggling.  Discussed increasing the wegovy to 0.'5mg'$  weekly and calling monthly to increase dose as tolerated.   F/u 3 mo  No orders of the defined types were placed in this encounter.   Wellington Hampshire, MD

## 2022-01-21 NOTE — Telephone Encounter (Signed)
They do not have this type of medication they are saying they been out for months can I get it up in ozempic, what I am already taking.  They said they do have that?

## 2022-01-27 ENCOUNTER — Other Ambulatory Visit (INDEPENDENT_AMBULATORY_CARE_PROVIDER_SITE_OTHER): Payer: Federal, State, Local not specified - PPO

## 2022-01-27 DIAGNOSIS — R7303 Prediabetes: Secondary | ICD-10-CM | POA: Diagnosis not present

## 2022-01-27 LAB — HEMOGLOBIN A1C: Hgb A1c MFr Bld: 6.2 % (ref 4.6–6.5)

## 2022-01-28 DIAGNOSIS — Z01419 Encounter for gynecological examination (general) (routine) without abnormal findings: Secondary | ICD-10-CM | POA: Diagnosis not present

## 2022-01-28 DIAGNOSIS — Z0000 Encounter for general adult medical examination without abnormal findings: Secondary | ICD-10-CM | POA: Diagnosis not present

## 2022-01-28 DIAGNOSIS — N760 Acute vaginitis: Secondary | ICD-10-CM | POA: Diagnosis not present

## 2022-01-28 DIAGNOSIS — R3989 Other symptoms and signs involving the genitourinary system: Secondary | ICD-10-CM | POA: Diagnosis not present

## 2022-01-28 NOTE — Progress Notes (Signed)
A1C just impaired.  Continue working on diet/exercise.  See if ok to send in zepbound 2.'5mg'$  weekly-please send-34m, no refill

## 2022-03-01 ENCOUNTER — Other Ambulatory Visit: Payer: Self-pay | Admitting: Family Medicine

## 2022-03-01 DIAGNOSIS — R7303 Prediabetes: Secondary | ICD-10-CM

## 2022-03-01 MED ORDER — SEMAGLUTIDE (1 MG/DOSE) 4 MG/3ML ~~LOC~~ SOPN: 1.0000 mg | PEN_INJECTOR | SUBCUTANEOUS | 0 refills | Status: DC

## 2022-03-01 NOTE — Telephone Encounter (Signed)
Patient comment: I need an upgrade on dosage. I have one left but is unable to use because the injection stop working. It will no longer turn LOV 01/21/2022

## 2022-03-02 ENCOUNTER — Encounter: Payer: Self-pay | Admitting: Family Medicine

## 2022-04-01 DIAGNOSIS — R3989 Other symptoms and signs involving the genitourinary system: Secondary | ICD-10-CM | POA: Diagnosis not present

## 2022-04-05 ENCOUNTER — Other Ambulatory Visit: Payer: Self-pay | Admitting: Family Medicine

## 2022-04-05 MED ORDER — SEMAGLUTIDE (1 MG/DOSE) 4 MG/3ML ~~LOC~~ SOPN: 1.0000 mg | PEN_INJECTOR | SUBCUTANEOUS | 0 refills | Status: DC

## 2022-04-21 ENCOUNTER — Encounter: Payer: Self-pay | Admitting: Family Medicine

## 2022-04-21 ENCOUNTER — Ambulatory Visit: Payer: Federal, State, Local not specified - PPO | Admitting: Family Medicine

## 2022-04-21 VITALS — BP 104/70 | HR 80 | Temp 97.6°F | Resp 18 | Ht 67.5 in | Wt 188.4 lb

## 2022-04-21 DIAGNOSIS — Z6830 Body mass index (BMI) 30.0-30.9, adult: Secondary | ICD-10-CM | POA: Diagnosis not present

## 2022-04-21 DIAGNOSIS — E6609 Other obesity due to excess calories: Secondary | ICD-10-CM

## 2022-04-21 DIAGNOSIS — R7303 Prediabetes: Secondary | ICD-10-CM

## 2022-04-21 DIAGNOSIS — Z23 Encounter for immunization: Secondary | ICD-10-CM | POA: Diagnosis not present

## 2022-04-21 MED ORDER — SEMAGLUTIDE (2 MG/DOSE) 8 MG/3ML ~~LOC~~ SOPN: 2.0000 mg | PEN_INJECTOR | SUBCUTANEOUS | 3 refills | Status: DC

## 2022-04-21 NOTE — Progress Notes (Signed)
Subjective:     Patient ID: Sandra Hawkins, female    DOB: 12-17-1960, 62 y.o.   MRN: [id number]  Chief Complaint  Patient presents with   Follow-up    3 month follow-up Refill of weight loss medication Review labs Just ate a cookie     HPI PreDM/DM-working on diet/exercise.  On ozempic '1mg'$ -doing well.  No nausea/diarrhea. Some constipation.  Doing Noni juice.  Needs to do better on diet.  Gets cravings a lot for sugars.  No SI.  Did labs at gyn in dec-no results in care everywhere.  Pt couldn't get in the portal-will bring  Obesity-working on exercise.    Health Maintenance Due  Topic Date Due   DTaP/Tdap/Td (2 - Tdap) 02/27/2020    Past Medical History:  Diagnosis Date   Allergy    Anxiety    Arthritis    Rheumatoid - knees   Diabetes mellitus without complication ([place])    Fibroid 04/03/2007   H/O dysmenorrhea 02/09/2007   H/O menorrhagia 02/08/2005   H/O varicella    Hepatitis B 11/14/2006   History of measles, mumps, or rubella    History of nausea 02/09/2007   With menses    Irregular menses    Monilial vaginitis 02/09/2004   Osteoporosis    Pain pelvic    Seasonal allergies    Spasm of muscle, back    09-08-2013, therepy helped   SVD (spontaneous vaginal delivery)    x 1   Urge incontinence 02/08/2006    Past Surgical History:  Procedure Laterality Date   BLADDER SUSPENSION  09/20/2011   Procedure: TRANSVAGINAL TAPE (TVT) PROCEDURE;  Surgeon: Delice Lesch, MD;  Location: [facility];  Service: Gynecology;  Laterality: N/A;   BREAST BIOPSY Left    CYSTOSCOPY  09/20/2011   Procedure: CYSTOSCOPY;  Surgeon: Delice Lesch, MD;  Location: [facility];  Service: Gynecology;;   CYSTOSCOPY N/A 11/10/2012   Procedure: CYSTOSCOPY;  Surgeon: Delice Lesch, MD;  Location: [facility];  Service: Gynecology;  Laterality: N/A;   DILATION AND CURETTAGE OF UTERUS     JOINT REPLACEMENT     right knee   KNEE SURGERY     LAPAROSCOPIC HYSTERECTOMY  09/20/2011    Procedure: HYSTERECTOMY TOTAL LAPAROSCOPIC;  Surgeon: Delice Lesch, MD;  Location: [facility];  Service: Gynecology;  Laterality: N/A;   MYOMECTOMY ABDOMINAL APPROACH  04/03/2007   TRANSVAGINAL TAPE (TVT) REMOVAL N/A 11/10/2012   Procedure: TRANSVAGINAL TAPE (TVT) REMOVAL;  Surgeon: Delice Lesch, MD;  Location: [facility];  Service: Gynecology;  Laterality: N/A;  Removal of Mesh   WISDOM TOOTH EXTRACTION      Outpatient Medications Prior to Visit  Medication Sig Dispense Refill   ofloxacin (OCUFLOX) 0.3 % ophthalmic solution INSTILL 2 DROPS INTO RIGHT EYE EVERY 6 HOURS FOR 7 DAYS     solifenacin (VESICARE) 10 MG tablet Take 1 tablet by mouth daily.     Semaglutide, 1 MG/DOSE, 4 MG/3ML SOPN Inject 1 mg as directed once a week. 3 mL 0   No facility-administered medications prior to visit.    Allergies  Allergen Reactions   Cortizone-10 Feminine Itch [Hydrocortisone] Other (See Comments)   Dairy Aid [Tilactase] Other (See Comments)    bloating   Hetastarch Other (See Comments)   Lactose Other (See Comments)   Shellfish Allergy Hives and Other (See Comments)   Starch Other (See Comments)    bloating   Tramadol Itching   Cortisone Rash  ROS neg/noncontributory except as noted HPI/below [place] rep-stressed.      Objective:     BP 104/70   Pulse 80   Temp 97.6 F (36.4 C) (Temporal)   Resp 18   Ht 5' 7.5" (1.715 m)   Wt 188 lb 6 oz (85.4 kg)   LMP 09/13/2011   SpO2 99%   BMI 29.07 kg/m  Wt Readings from Last 3 Encounters:  04/21/22 188 lb 6 oz (85.4 kg)  01/21/22 188 lb 8 oz (85.5 kg)  10/26/21 200 lb 2 oz (90.8 kg)    Physical Exam   Gen: WDWN NAD HEENT: NCAT, conjunctiva not injected, sclera nonicteric NECK:  supple, no thyromegaly, no nodes, no carotid bruits CARDIAC: RRR, S1S2+, no murmur. DP 2+B LUNGS: CTAB. No wheezes ABDOMEN:  BS+, soft, NTND, No HSM, no masses EXT:  no edema MSK: no gross abnormalities.  NEURO: A&O x3.  CN II-XII intact.  PSYCH: normal  mood. Good eye contact     Assessment & Plan:   Problem List Items Addressed This Visit       Other   Pre-diabetes - Primary   Other Visit Diagnoses     Class 1 obesity due to excess calories with serious comorbidity and body mass index (BMI) of 30.0 to 30.9 in adult       Relevant Medications   Semaglutide, 2 MG/DOSE, 8 MG/3ML SOPN   Need for Tdap vaccination       Relevant Orders   Tdap vaccine greater than or equal to 7yo IM      preDM/DM-doing well on ozempic, but still eating too much sugar-will increase to '2mg'$  weekly.  Work on avoiding sweets and diet.  Get copy labs from gyn.  Obesity-wt stable now-needs to work on dietary discretions.  Inc ozempic to '2mg'$ .    Meds ordered this encounter  Medications   Semaglutide, 2 MG/DOSE, 8 MG/3ML SOPN    Sig: Inject 2 mg as directed once a week.    Dispense:  3 mL    Refill:  3    Wellington Hampshire, MD

## 2022-04-21 NOTE — Patient Instructions (Signed)
It was very nice to see you today!  Hold Noni juice for 2 wks.   Increase ozempic to '2mg'$ ..  take colace '100mg'$  daily to prevent constipation.  Massage tummy.    PLEASE NOTE:  If you had any lab tests please let us know if you have not heard back within a few days. You may see your results on MyChart before we have a chance to review them but we will give you a call once they are reviewed by [place]. If we ordered any referrals today, please let us know if you have not heard from their office within the next week.   Please try these tips to maintain a healthy lifestyle:  Eat most of your calories during the day when you are active. Eliminate processed foods including packaged sweets (pies, cakes, cookies), reduce intake of potatoes, white bread, white pasta, and white rice. Look for whole grain options, oat flour or almond flour.  Each meal should contain half fruits/vegetables, one quarter protein, and one quarter carbs (no bigger than a computer mouse).  Cut down on sweet beverages. This includes juice, soda, and sweet tea. Also watch fruit intake, though this is a healthier sweet option, it still contains natural sugar! Limit to 3 servings daily.  Drink at least 1 glass of water with each meal and aim for at least 8 glasses per day  Exercise at least 150 minutes every week.

## 2022-06-02 DIAGNOSIS — R3989 Other symptoms and signs involving the genitourinary system: Secondary | ICD-10-CM | POA: Diagnosis not present

## 2022-06-23 DIAGNOSIS — Z1231 Encounter for screening mammogram for malignant neoplasm of breast: Secondary | ICD-10-CM | POA: Diagnosis not present

## 2022-07-22 ENCOUNTER — Ambulatory Visit: Payer: Federal, State, Local not specified - PPO | Admitting: Family Medicine

## 2022-08-11 ENCOUNTER — Other Ambulatory Visit: Payer: Self-pay | Admitting: Family Medicine

## 2022-08-11 ENCOUNTER — Encounter: Payer: Self-pay | Admitting: Family Medicine

## 2022-08-11 ENCOUNTER — Ambulatory Visit: Payer: Federal, State, Local not specified - PPO | Admitting: Family Medicine

## 2022-08-11 VITALS — BP 109/69 | HR 77 | Temp 97.9°F | Resp 18 | Ht 67.5 in | Wt 172.0 lb

## 2022-08-11 DIAGNOSIS — R7303 Prediabetes: Secondary | ICD-10-CM

## 2022-08-11 LAB — MICROALBUMIN / CREATININE URINE RATIO
Creatinine,U: 141.5 mg/dL
Microalb Creat Ratio: 0.5 mg/g (ref 0.0–30.0)
Microalb, Ur: 0.8 mg/dL (ref 0.0–1.9)

## 2022-08-11 LAB — COMPREHENSIVE METABOLIC PANEL
ALT: 11 U/L (ref 0–35)
AST: 14 U/L (ref 0–37)
Albumin: 4 g/dL (ref 3.5–5.2)
Alkaline Phosphatase: 101 U/L (ref 39–117)
BUN: 17 mg/dL (ref 6–23)
CO2: 29 mEq/L (ref 19–32)
Calcium: 9.7 mg/dL (ref 8.4–10.5)
Chloride: 106 mEq/L (ref 96–112)
Creatinine, Ser: 0.87 mg/dL (ref 0.40–1.20)
GFR: 72.51 mL/min (ref >60.00)
Glucose, Bld: 98 mg/dL (ref 70–99)
Potassium: 4.9 mEq/L (ref 3.5–5.1)
Sodium: 141 mEq/L (ref 135–145)
Total Bilirubin: 0.6 mg/dL (ref 0.2–1.2)
Total Protein: 7.7 g/dL (ref 6.0–8.3)

## 2022-08-11 LAB — LIPID PANEL
Cholesterol: 156 mg/dL (ref 0–200)
HDL: 55.3 mg/dL (ref >39.00)
LDL Cholesterol: 90 mg/dL (ref 0–99)
NonHDL: 100.89
Total CHOL/HDL Ratio: 3
Triglycerides: 56 mg/dL (ref 0.0–149.0)
VLDL: 11.2 mg/dL (ref 0.0–40.0)

## 2022-08-11 LAB — HEMOGLOBIN A1C: Hgb A1c MFr Bld: 5.9 % (ref 4.6–6.5)

## 2022-08-11 NOTE — Progress Notes (Signed)
Subjective:     Patient ID: Sandra Hawkins, female    DOB: [birth year], 62 y.o.   MRN: [id number]  Chief Complaint  Patient presents with   Medical Management of Chronic Issues    3 month follow-up on dm     HPI She is accompanied by her granddaughter.  PreDM/DM - She has been losing weight since her last visit. States she has not been eating as healthy as she knows she should. Plans to start going on walks with her granddaughter to stay more active. Denies any headaches, dizziness, chest pain, SOB, or constipation. We discussed her options for a healthier diet. No SE to ozempic 2mg  weekly  There are no preventive care reminders to display for this patient.   Past Medical History:  Diagnosis Date   Allergy    Anxiety    Arthritis    Rheumatoid - knees   Diabetes mellitus without complication ([facility])    Fibroid 04/03/2007   H/O dysmenorrhea 02/09/2007   H/O menorrhagia 02/08/2005   H/O varicella    Hepatitis B 11/14/2006   History of measles, mumps, or rubella    History of nausea 02/09/2007   With menses    Irregular menses    Monilial vaginitis 02/09/2004   Osteoporosis    Pain pelvic    Seasonal allergies    Spasm of muscle, back    09-08-2013, therepy helped   SVD (spontaneous vaginal delivery)    x 1   Urge incontinence 02/08/2006    Past Surgical History:  Procedure Laterality Date   BLADDER SUSPENSION  09/20/2011   Procedure: TRANSVAGINAL TAPE (TVT) PROCEDURE;  Surgeon: Purcell Nails, MD;  Location: [facility];  Service: Gynecology;  Laterality: N/A;   BREAST BIOPSY Left    CYSTOSCOPY  09/20/2011   Procedure: CYSTOSCOPY;  Surgeon: Purcell Nails, MD;  Location: [facility];  Service: Gynecology;;   CYSTOSCOPY N/A 11/10/2012   Procedure: CYSTOSCOPY;  Surgeon: Purcell Nails, MD;  Location: [facility];  Service: Gynecology;  Laterality: N/A;   DILATION AND CURETTAGE OF UTERUS     JOINT REPLACEMENT     right knee   KNEE SURGERY     LAPAROSCOPIC HYSTERECTOMY   09/20/2011   Procedure: HYSTERECTOMY TOTAL LAPAROSCOPIC;  Surgeon: Purcell Nails, MD;  Location: [facility];  Service: Gynecology;  Laterality: N/A;   MYOMECTOMY ABDOMINAL APPROACH  04/03/2007   TRANSVAGINAL TAPE (TVT) REMOVAL N/A 11/10/2012   Procedure: TRANSVAGINAL TAPE (TVT) REMOVAL;  Surgeon: Purcell Nails, MD;  Location: [facility];  Service: Gynecology;  Laterality: N/A;  Removal of Mesh   WISDOM TOOTH EXTRACTION       Current Outpatient Medications:    Semaglutide, 2 MG/DOSE, (OZEMPIC, 2 MG/DOSE,) 8 MG/3ML SOPN, INJECT 2 MG  SUBCUTANEOUSLY ONCE A WEEK, Disp: 9 mL, Rfl: 3  Allergies  Allergen Reactions   Cortizone-10 Feminine Itch [Hydrocortisone] Other (See Comments)   Dairy Aid [Tilactase] Other (See Comments)    bloating   Hetastarch Other (See Comments)   Lactose Other (See Comments)   Shellfish Allergy Hives and Other (See Comments)   Starch Other (See Comments)    bloating   Tramadol Itching   Cortisone Rash   ROS neg/noncontributory except as noted HPI/below      Objective:     BP 109/69   Pulse 77   Temp 97.9 F (36.6 C) (Temporal)   Resp 18   Ht 5' 7.5" (1.715 m)   Wt 172 lb (78 kg)  LMP 09/13/2011   SpO2 99%   BMI 26.54 kg/m  Wt Readings from Last 3 Encounters:  08/11/22 172 lb (78 kg)  04/21/22 188 lb 6 oz (85.4 kg)  01/21/22 188 lb 8 oz (85.5 kg)    Physical Exam   Gen: WDWN NAD HEENT: NCAT, conjunctiva not injected, sclera nonicteric NECK:  supple, no thyromegaly, no nodes, no carotid bruits CARDIAC: RRR, S1S2+, no murmur. DP 2+B LUNGS: CTAB. No wheezes ABDOMEN:  BS+, soft, NTND, No HSM, no masses EXT:  no edema MSK: no gross abnormalities.  NEURO: A&O x3.  CN II-XII intact.  PSYCH: normal mood. Good eye contact     Assessment & Plan:  Pre-diabetes Assessment & Plan: Chronic.  Has had A1C 6.5 so DM in past.  Well controlled on ozempic 2mg  weekly.  Continue.  Discussed diet/exercise  Orders: -     Comprehensive metabolic panel -      Hemoglobin A1c -     Lipid panel -     Microalbumin / creatinine urine ratio    Return in about 6 months (around 02/11/2023) for DM.   I,Rachel Rivera,acting as a scribe for Angelena Sole, MD.,have documented all relevant documentation on the behalf of Angelena Sole, MD,as directed by  Angelena Sole, MD while in the presence of Angelena Sole, MD.  I, Angelena Sole, MD, have reviewed all documentation for this visit. The documentation on 08/11/22 for the exam, diagnosis, procedures, and orders are all accurate and complete.    Angelena Sole, MD

## 2022-08-11 NOTE — Patient Instructions (Signed)

## 2022-08-11 NOTE — Assessment & Plan Note (Signed)
Chronic.  Has had A1C 6.5 so DM in past.  Well controlled on ozempic 2mg  weekly.  Continue.  Discussed diet/exercise

## 2022-08-12 NOTE — Progress Notes (Signed)
Awesome.  Same meds.  Work on diet/exercise

## 2022-08-13 ENCOUNTER — Encounter: Payer: Self-pay | Admitting: Family Medicine

## 2022-11-04 ENCOUNTER — Telehealth: Payer: Self-pay

## 2022-11-04 ENCOUNTER — Encounter: Payer: Self-pay | Admitting: *Deleted

## 2022-11-04 ENCOUNTER — Other Ambulatory Visit (HOSPITAL_COMMUNITY): Payer: Self-pay

## 2022-11-04 NOTE — Telephone Encounter (Signed)
Pharmacy Patient Advocate Encounter  Received notification from [facility] that Prior Authorization for Ozempic has been APPROVED from 10/05/2022 to 11/04/2023   PA #/Case ID/Reference #: Not provided in approval, approval letter attached to media

## 2022-11-04 NOTE — Telephone Encounter (Signed)
Patient notified of message below.

## 2022-11-04 NOTE — Telephone Encounter (Signed)
Pharmacy Patient Advocate Encounter   Received notification from CoverMyMeds that prior authorization for Ozempic is required/requested.   Insurance verification completed.   The patient is insured through [facility] .   Per test claim: PA required; PA submitted to [facility] via CoverMyMeds Key/confirmation #/EOC Key: [id number] Status is pending

## 2023-02-16 ENCOUNTER — Encounter: Payer: Self-pay | Admitting: Family Medicine

## 2023-02-16 ENCOUNTER — Ambulatory Visit: Payer: BC Managed Care – PPO | Admitting: Family Medicine

## 2023-02-16 VITALS — BP 115/71 | HR 61 | Temp 97.7°F | Resp 18 | Ht 67.5 in | Wt 174.8 lb

## 2023-02-16 DIAGNOSIS — R7303 Prediabetes: Secondary | ICD-10-CM

## 2023-02-16 DIAGNOSIS — R202 Paresthesia of skin: Secondary | ICD-10-CM | POA: Diagnosis not present

## 2023-02-16 LAB — CBC WITH DIFFERENTIAL/PLATELET
Basophils Absolute: 0 10*3/uL (ref 0.0–0.1)
Basophils Relative: 0.4 % (ref 0.0–3.0)
Eosinophils Absolute: 0.1 10*3/uL (ref 0.0–0.7)
Eosinophils Relative: 1.6 % (ref 0.0–5.0)
HCT: 37.8 % (ref 36.0–46.0)
Hemoglobin: 12.2 g/dL (ref 12.0–15.0)
Lymphocytes Relative: 40.6 % (ref 12.0–46.0)
Lymphs Abs: 3.1 10*3/uL (ref 0.7–4.0)
MCHC: 32.2 g/dL (ref 30.0–36.0)
MCV: 84.4 fL (ref 78.0–100.0)
Monocytes Absolute: 0.5 10*3/uL (ref 0.1–1.0)
Monocytes Relative: 6.8 % (ref 3.0–12.0)
Neutro Abs: 3.9 10*3/uL (ref 1.4–7.7)
Neutrophils Relative %: 50.6 % (ref 43.0–77.0)
Platelets: 205 10*3/uL (ref 150.0–400.0)
RBC: 4.47 Mil/uL (ref 3.87–5.11)
RDW: 15.6 % — ABNORMAL HIGH (ref 11.5–15.5)
WBC: 7.7 10*3/uL (ref 4.0–10.5)

## 2023-02-16 LAB — HEMOGLOBIN A1C: Hgb A1c MFr Bld: 6.2 % (ref 4.6–6.5)

## 2023-02-16 NOTE — Assessment & Plan Note (Signed)
 Chronic.  Has had A1C 6.5 so DM in past.  Well controlled on ozempic 2mg  weekly.  Continue.  Discussed diet/exercise.  Pt still not doing everything she should.

## 2023-02-16 NOTE — Patient Instructions (Addendum)
 It was very nice to see you today!  Add magnesium daily 250mg    PLEASE NOTE:  If you had any lab tests please let us  know if you have not heard back within a few days. You may see your results on MyChart before we have a chance to review them but we will give you a call once they are reviewed by us . If we ordered any referrals today, please let us  know if you have not heard from their office within the next week.   Please try these tips to maintain a healthy lifestyle:  Eat most of your calories during the day when you are active. Eliminate processed foods including packaged sweets (pies, cakes, cookies), reduce intake of potatoes, white bread, white pasta, and white rice. Look for whole grain options, oat flour or almond flour.  Each meal should contain half fruits/vegetables, one quarter protein, and one quarter carbs (no bigger than a computer mouse).  Cut down on sweet beverages. This includes juice, soda, and sweet tea. Also watch fruit intake, though this is a healthier sweet option, it still contains natural sugar! Limit to 3 servings daily.  Drink at least 1 glass of water  with each meal and aim for at least 8 glasses per day  Exercise at least 150 minutes every week.

## 2023-02-16 NOTE — Progress Notes (Signed)
 Subjective:     Patient ID: Sandra Hawkins, female    DOB: [birth year], 62 y.o.   MRN: [id number]  Chief Complaint  Patient presents with   Medical Management of Chronic Issues    6 month follow-up on dm Would like blood work done today    HPI She is accompanied by her granddaughter.  PreDM/DM - She has been losing weight since her last visit. States she has not been eating as healthy as she knows she should. . Denies any headaches, dizziness, chest pain, SOB, or constipation. We discussed her options for a healthier diet. No SE to ozempic  2mg  weekly.  Not exercising.  Occ toes get cramps.  There are no preventive care reminders to display for this patient.    Past Medical History:  Diagnosis Date   Allergy    Anxiety    Arthritis    Rheumatoid - knees   Diabetes mellitus without complication ([facility])    Fibroid 04/03/2007   H/O dysmenorrhea 02/09/2007   H/O menorrhagia 02/08/2005   H/O varicella    Hepatitis B 11/14/2006   History of measles, mumps, or rubella    History of nausea 02/09/2007   With menses    Irregular menses    Monilial vaginitis 02/09/2004   Osteoporosis    Pain pelvic    Seasonal allergies    Spasm of muscle, back    09-08-2013, therepy helped   SVD (spontaneous vaginal delivery)    x 1   Urge incontinence 02/08/2006    Past Surgical History:  Procedure Laterality Date   BLADDER SUSPENSION  09/20/2011   Procedure: TRANSVAGINAL TAPE (TVT) PROCEDURE;  Surgeon: Jon CINDERELLA Rummer, MD;  Location: [facility];  Service: Gynecology;  Laterality: N/A;   BREAST BIOPSY Left    CYSTOSCOPY  09/20/2011   Procedure: CYSTOSCOPY;  Surgeon: Jon CINDERELLA Rummer, MD;  Location: [facility];  Service: Gynecology;;   CYSTOSCOPY N/A 11/10/2012   Procedure: CYSTOSCOPY;  Surgeon: Jon CINDERELLA Rummer, MD;  Location: [facility];  Service: Gynecology;  Laterality: N/A;   DILATION AND CURETTAGE OF UTERUS     JOINT REPLACEMENT     right knee   KNEE SURGERY     LAPAROSCOPIC HYSTERECTOMY   09/20/2011   Procedure: HYSTERECTOMY TOTAL LAPAROSCOPIC;  Surgeon: Jon CINDERELLA Rummer, MD;  Location: [facility];  Service: Gynecology;  Laterality: N/A;   MYOMECTOMY ABDOMINAL APPROACH  04/03/2007   TRANSVAGINAL TAPE (TVT) REMOVAL N/A 11/10/2012   Procedure: TRANSVAGINAL TAPE (TVT) REMOVAL;  Surgeon: Jon CINDERELLA Rummer, MD;  Location: [facility];  Service: Gynecology;  Laterality: N/A;  Removal of Mesh   WISDOM TOOTH EXTRACTION       Current Outpatient Medications:    Semaglutide , 2 MG/DOSE, (OZEMPIC , 2 MG/DOSE,) 8 MG/3ML SOPN, INJECT 2 MG  SUBCUTANEOUSLY ONCE A WEEK, Disp: 9 mL, Rfl: 3  Allergies  Allergen Reactions   Cortizone-10 Feminine Itch [Hydrocortisone] Other (See Comments)   Dairy Aid [Tilactase] Other (See Comments)    bloating   Hetastarch Other (See Comments)   Lactose Other (See Comments)   Shellfish Allergy Hives and Other (See Comments)   Starch Other (See Comments)    bloating   Tramadol  Itching   Cortisone Rash   ROS neg/noncontributory except as noted HPI/below      Objective:     BP 115/71   Pulse 61   Temp 97.7 F (36.5 C) (Temporal)   Resp 18   Ht 5' 7.5 (1.715 m)   Wt 174 lb  12.8 oz (79.3 kg)   LMP 09/13/2011   SpO2 100%   BMI 26.97 kg/m  Wt Readings from Last 3 Encounters:  02/16/23 174 lb 12.8 oz (79.3 kg)  08/11/22 172 lb (78 kg)  04/21/22 188 lb 6 oz (85.4 kg)    Physical Exam   Gen: WDWN NAD HEENT: NCAT, conjunctiva not injected, sclera nonicteric NECK:  supple, no thyromegaly, no nodes, no carotid bruits CARDIAC: RRR, S1S2+, no murmur. DP 2+B LUNGS: CTAB. No wheezes ABDOMEN:  BS+, soft, NTND, No HSM, no masses EXT:  no edema MSK: no gross abnormalities.  NEURO: A&O x3.  CN II-XII intact.  PSYCH: normal mood. Good eye contact     Assessment & Plan:  Pre-diabetes Assessment & Plan: Chronic.  Has had A1C 6.5 so DM in past.  Well controlled on ozempic  2mg  weekly.  Continue.  Discussed diet/exercise.  Pt still not doing everything she  should.    Orders: -     Comprehensive metabolic panel -     Hemoglobin A1c -     CBC with Differential/Platelet -     Magnesium -     Vitamin B12  Tingling -     Magnesium -     Vitamin B12  Tingling/cramps in feet-check B12/mg.  Can do Mg 250mg  daily.  Stretches.  Drink plenty of water .   Return in about 6 months (around 08/16/2023) for DM.    Jenkins CHRISTELLA Carrel, MD

## 2023-02-17 LAB — COMPREHENSIVE METABOLIC PANEL
ALT: 12 U/L (ref 0–35)
AST: 16 U/L (ref 0–37)
Albumin: 3.9 g/dL (ref 3.5–5.2)
Alkaline Phosphatase: 99 U/L (ref 39–117)
BUN: 15 mg/dL (ref 6–23)
CO2: 27 meq/L (ref 19–32)
Calcium: 9.1 mg/dL (ref 8.4–10.5)
Chloride: 104 meq/L (ref 96–112)
Creatinine, Ser: 0.87 mg/dL (ref 0.40–1.20)
GFR: 72.25 mL/min (ref >60.00)
Glucose, Bld: 76 mg/dL (ref 70–99)
Potassium: 3.9 meq/L (ref 3.5–5.1)
Sodium: 138 meq/L (ref 135–145)
Total Bilirubin: 0.4 mg/dL (ref 0.2–1.2)
Total Protein: 7.8 g/dL (ref 6.0–8.3)

## 2023-02-17 LAB — MAGNESIUM: Magnesium: 2 mg/dL (ref 1.5–2.5)

## 2023-02-17 LAB — VITAMIN B12: Vitamin B-12: 310 pg/mL (ref 211–911)

## 2023-02-17 NOTE — Progress Notes (Signed)
 Labs ok/at goal except B12 is low end of normal-should take daily otc

## 2023-07-10 ENCOUNTER — Other Ambulatory Visit: Payer: Self-pay | Admitting: Family Medicine

## 2023-08-16 ENCOUNTER — Ambulatory Visit: Payer: BC Managed Care – PPO | Admitting: Family Medicine

## 2023-08-19 ENCOUNTER — Encounter: Payer: Self-pay | Admitting: Family Medicine

## 2023-08-19 ENCOUNTER — Ambulatory Visit (INDEPENDENT_AMBULATORY_CARE_PROVIDER_SITE_OTHER): Admitting: Family Medicine

## 2023-08-19 VITALS — BP 111/75 | HR 68 | Temp 97.8°F | Resp 18 | Ht 67.5 in | Wt 178.4 lb

## 2023-08-19 DIAGNOSIS — R252 Cramp and spasm: Secondary | ICD-10-CM | POA: Diagnosis not present

## 2023-08-19 DIAGNOSIS — R7303 Prediabetes: Secondary | ICD-10-CM | POA: Diagnosis not present

## 2023-08-19 DIAGNOSIS — E538 Deficiency of other specified B group vitamins: Secondary | ICD-10-CM

## 2023-08-19 LAB — LIPID PANEL
Cholesterol: 159 mg/dL (ref 0–200)
HDL: 61.5 mg/dL (ref >39.00)
LDL Cholesterol: 83 mg/dL (ref 0–99)
NonHDL: 97.48
Total CHOL/HDL Ratio: 3
Triglycerides: 70 mg/dL (ref 0.0–149.0)
VLDL: 14 mg/dL (ref 0.0–40.0)

## 2023-08-19 LAB — COMPREHENSIVE METABOLIC PANEL WITH GFR
ALT: 13 U/L (ref 0–35)
AST: 15 U/L (ref 0–37)
Albumin: 3.9 g/dL (ref 3.5–5.2)
Alkaline Phosphatase: 105 U/L (ref 39–117)
BUN: 12 mg/dL (ref 6–23)
CO2: 26 meq/L (ref 19–32)
Calcium: 8.8 mg/dL (ref 8.4–10.5)
Chloride: 107 meq/L (ref 96–112)
Creatinine, Ser: 0.83 mg/dL (ref 0.40–1.20)
GFR: 76.17 mL/min (ref >60.00)
Glucose, Bld: 84 mg/dL (ref 70–99)
Potassium: 3.8 meq/L (ref 3.5–5.1)
Sodium: 139 meq/L (ref 135–145)
Total Bilirubin: 0.6 mg/dL (ref 0.2–1.2)
Total Protein: 7.3 g/dL (ref 6.0–8.3)

## 2023-08-19 LAB — HEMOGLOBIN A1C: Hgb A1c MFr Bld: 6.2 % (ref 4.6–6.5)

## 2023-08-19 LAB — VITAMIN B12: Vitamin B-12: 460 pg/mL (ref 211–911)

## 2023-08-19 LAB — MAGNESIUM: Magnesium: 2 mg/dL (ref 1.5–2.5)

## 2023-08-19 LAB — URIC ACID: Uric Acid, Serum: 4.2 mg/dL (ref 2.4–7.0)

## 2023-08-19 NOTE — Patient Instructions (Addendum)
 It was very nice to see you today!  Try the B12 alone for 1 wk, and see if ok.  Then try stopping and just doing the vitamin and see which is doing it.    Try pepcid  daily.   Can try ginger for nausea.   Magnesium glycinate nightly   [phone] for mammogram,     PLEASE NOTE:  If you had any lab tests please let us  know if you have not heard back within a few days. You may see your results on MyChart before we have a chance to review them but we will give you a call once they are reviewed by us . If we ordered any referrals today, please let us  know if you have not heard from their office within the next week.   Please try these tips to maintain a healthy lifestyle:  Eat most of your calories during the day when you are active. Eliminate processed foods including packaged sweets (pies, cakes, cookies), reduce intake of potatoes, white bread, white pasta, and white rice. Look for whole grain options, oat flour or almond flour.  Each meal should contain half fruits/vegetables, one quarter protein, and one quarter carbs (no bigger than a computer mouse).  Cut down on sweet beverages. This includes juice, soda, and sweet tea. Also watch fruit intake, though this is a healthier sweet option, it still contains natural sugar! Limit to 3 servings daily.  Drink at least 1 glass of water  with each meal and aim for at least 8 glasses per day  Exercise at least 150 minutes every week.

## 2023-08-19 NOTE — Progress Notes (Unsigned)
 Subjective:     Patient ID: Sandra Hawkins, female    DOB: [birth year], 63 y.o.   MRN: [id number]  Chief Complaint  Patient presents with   Medical Management of Chronic Issues    6 month follow up Discuss gout, find out if she has gout, feet cramping Discuss ozempic      HPI-here w/granddaughter Predm-was dm in 2023 per A1c 6.5 Discussed the use of AI scribe software for clinical note transcription with the patient, who gave verbal consent to proceed.  History of Present Illness Sandra Hawkins is a 63 year old female who presents for follow-up on prediabetes. She is accompanied by her granddaughter.  She has been experiencing cramps in her knee and left foot for about a year, primarily when laying down with her legs crossed.  She consumes sodas, which she acknowledges negatively impact her blood sugar levels and vision, causing 'glary eyes.' She has decided to stop drinking sodas and is replacing them with water  and occasionally flavored mineral water  or Crystal Light. She drinks a lot of water  but feels she should increase her intake further.  She is currently on Ozempic  for her prediabetes and experiences side effects such as nausea and burping with an 'egg flavor,' especially when taking her multivitamins and B12. She is trying to determine if these symptoms are due to the combination of medications or one specific supplement.  She has gained six pounds since last year, which she attributes to her soda consumption. She has lost about thirty pounds overall and is trying to maintain her weight loss.  No chest pain or shortness of breath except when attempting to run, which she attributes to a lack of regular exercise. 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 Only walks up to homes if packages      Health Maintenance Due  Topic Date Due   MAMMOGRAM  06/23/2023    Past Medical History:   Diagnosis Date   Allergy    Anxiety    Arthritis    Rheumatoid - knees   Diabetes mellitus without complication (HCC)    Fibroid 04/03/2007   H/O dysmenorrhea 02/09/2007   H/O menorrhagia 02/08/2005   H/O varicella    Hepatitis B 11/14/2006   History of measles, mumps, or rubella    History of nausea 02/09/2007   With menses    Irregular menses    Monilial vaginitis 02/09/2004   Osteoporosis    Pain pelvic    Seasonal allergies    Spasm of muscle, back    09-08-2013, therepy helped   SVD (spontaneous vaginal delivery)    x 1   Urge incontinence 02/08/2006    Past Surgical History:  Procedure Laterality Date   BLADDER SUSPENSION  09/20/2011   Procedure: TRANSVAGINAL TAPE (TVT) PROCEDURE;  Surgeon: Jon CINDERELLA Rummer, MD;  Location: [facility];  Service: Gynecology;  Laterality: N/A;   BREAST BIOPSY Left    CYSTOSCOPY  09/20/2011   Procedure: CYSTOSCOPY;  Surgeon: Jon CINDERELLA Rummer, MD;  Location: [facility];  Service: Gynecology;;   CYSTOSCOPY N/A 11/10/2012   Procedure: CYSTOSCOPY;  Surgeon: Jon CINDERELLA Rummer, MD;  Location: [facility];  Service: Gynecology;  Laterality: N/A;   DILATION AND CURETTAGE OF UTERUS     JOINT REPLACEMENT     right knee   KNEE SURGERY     LAPAROSCOPIC HYSTERECTOMY  09/20/2011   Procedure: HYSTERECTOMY TOTAL LAPAROSCOPIC;  Surgeon: Jon CINDERELLA  Henry, MD;  Location: [facility];  Service: Gynecology;  Laterality: N/A;   MYOMECTOMY ABDOMINAL APPROACH  04/03/2007   TRANSVAGINAL TAPE (TVT) REMOVAL N/A 11/10/2012   Procedure: TRANSVAGINAL TAPE (TVT) REMOVAL;  Surgeon: Jon CINDERELLA Henry, MD;  Location: [facility];  Service: Gynecology;  Laterality: N/A;  Removal of Mesh   WISDOM TOOTH EXTRACTION       Current Outpatient Medications:    Cyanocobalamin  (B-12 PO), Take by mouth., Disp: , Rfl:    Multiple Vitamin (MULTIVITAMIN) tablet, Take 1 tablet by mouth daily., Disp: , Rfl:    OZEMPIC , 2 MG/DOSE, 8 MG/3ML SOPN, INJECT  2MG  SUBCUTANEOUSLY ONCE A WEEK, Disp: 9 mL, Rfl:  0  Allergies  Allergen Reactions   Cortizone-10 Feminine Itch [Hydrocortisone] Other (See Comments)   Dairy Aid [Tilactase] Other (See Comments)    bloating   Hetastarch Other (See Comments)   Lactose Other (See Comments)   Shellfish Allergy Hives and Other (See Comments)   Starch Other (See Comments)    bloating   Tramadol  Itching   Cortisone Rash   ROS neg/noncontributory except as noted HPI/below      Objective:     BP 111/75   Pulse 68   Temp 97.8 F (36.6 C) (Temporal)   Resp 18   Ht 5' 7.5 (1.715 m)   Wt 178 lb 6 oz (80.9 kg)   LMP 09/13/2011   SpO2 98%   BMI 27.53 kg/m  Wt Readings from Last 3 Encounters:  08/19/23 178 lb 6 oz (80.9 kg)  02/16/23 174 lb 12.8 oz (79.3 kg)  08/11/22 172 lb (78 kg)    Physical Exam   Gen: WDWN NAD HEENT: NCAT, conjunctiva not injected, sclera nonicteric NECK:  supple, no thyromegaly, no nodes, no carotid bruits CARDIAC: RRR, S1S2+, no murmur. DP 1+B LUNGS: CTAB. No wheezes ABDOMEN:  BS+, soft, NTND, No HSM, no masses EXT:  no edema MSK: no gross abnormalities.  NEURO: A&O x3.  CN II-XII intact.  PSYCH: normal mood. Good eye contact     Assessment & Plan:  Pre-diabetes -     Comprehensive metabolic panel with GFR -     Hemoglobin A1c -     Lipid panel  Vitamin B12 deficiency -     Vitamin B12  Leg cramps -     Comprehensive metabolic panel with GFR -     Uric acid -     Magnesium  Assessment and Plan Assessment & Plan Prediabetes   She reports increased blood glucose levels, especially after consuming sodas, which also affects her vision. Currently on Ozempic , which effectively manages her condition, but she experiences nausea and burping as side effects. Discussed potential side effects of Ozempic , including slowed gut motility leading to indigestion and nausea. She has gained six pounds since last year, likely due to increased soda consumption. Emphasized the importance of diet and exercise in managing  prediabetes and the potential for weight loss to improve her condition. Continue Ozempic  as prescribed. Advise cessation of soda consumption to manage blood glucose levels. Consider switching to Mounjaro if side effects persist but aware can have same SE. Advise taking Pepcid  daily to manage nausea. Monitor weight and dietary habits.  Medication side effects   She experiences nausea and burping with an egg-like taste, potentially due to the combination of Ozempic  and multivitamins/B12. Explained that these symptoms are common side effects due to slowed gut motility from Ozempic . Advised to experiment with taking B12 and multivitamins separately to identify the  cause of nausea. Discussed the potential benefit of taking vitamins with a small amount of food to reduce symptoms. Advise taking B12 for a week to assess tolerance. Advise taking multivitamins separately to identify cause of nausea. Consider taking vitamins with a small amount of food.  Leg cramps   She reports experiencing leg cramps, particularly in the left leg, when lying down with legs crossed. The cramps have been occurring for about a year and are worsening. Suspected nerve compression due to positioning rather than gout, as there are no signs of redness, heat, or swelling typical of gout. Discussed the importance of avoiding certain positions and incorporating stretching exercises to alleviate symptoms. Advise against crossing legs while lying down. Recommend stretching exercises for hamstrings and calves. Consider magnesium supplementation at night to help with cramps. Evaluate for gout if symptoms persist.  General Health Maintenance   Emphasized maintaining an active lifestyle and managing dietary habits to support overall health and manage prediabetes. Discussed the benefits of regular physical activity and the potential for weight loss to improve overall health. Encourage regular physical activity, starting with short walks. Advise  reducing sugar intake, particularly from sodas. Encourage hydration with water  instead of sugary drinks.    Return in about 6 months (around 02/19/2024) for chronic follow-up.  Jenkins CHRISTELLA Carrel, MD

## 2023-08-21 ENCOUNTER — Ambulatory Visit: Payer: Self-pay | Admitting: Family Medicine

## 2023-08-21 NOTE — Progress Notes (Signed)
 Labs stable.  Continue working on diet/exercise and meds

## 2023-08-26 ENCOUNTER — Encounter: Payer: Self-pay | Admitting: Advanced Practice Midwife

## 2023-09-28 ENCOUNTER — Ambulatory Visit: Payer: Self-pay | Admitting: Family Medicine

## 2023-10-02 ENCOUNTER — Other Ambulatory Visit: Payer: Self-pay | Admitting: Family Medicine

## 2023-11-11 ENCOUNTER — Other Ambulatory Visit (HOSPITAL_COMMUNITY): Payer: Self-pay

## 2023-12-23 ENCOUNTER — Other Ambulatory Visit: Payer: Self-pay | Admitting: Family Medicine

## 2023-12-27 ENCOUNTER — Other Ambulatory Visit (HOSPITAL_COMMUNITY): Payer: Self-pay

## 2023-12-27 ENCOUNTER — Telehealth: Payer: Self-pay

## 2023-12-27 NOTE — Telephone Encounter (Signed)
 Pharmacy Patient Advocate Encounter   Received notification from Onbase that prior authorization for Ozempic  (2 MG/DOSE) 8MG /3ML pen-injectors is required/requested.   Insurance verification completed.   The patient is insured through [facility].   Per test claim: PA required; PA submitted to above mentioned insurance via Latent Key/confirmation #/EOC [facility] Status is pending

## 2023-12-28 NOTE — Telephone Encounter (Signed)
 Pharmacy Patient Advocate Encounter  Received notification from [facility] that Prior Authorization for Ozempic  (2 MG/DOSE) 8MG /3ML pen-injectors has been APPROVED from 11/27/23 to 12/26/2024   PA #/Case ID/Reference #: [id number]

## 2024-01-30 ENCOUNTER — Emergency Department (HOSPITAL_COMMUNITY)
Admission: EM | Admit: 2024-01-30 | Discharge: 2024-01-30 | Disposition: A | Discharge status: Home / Self Care | Attending: Emergency Medicine | Admitting: Emergency Medicine

## 2024-01-30 ENCOUNTER — Emergency Department (HOSPITAL_COMMUNITY)

## 2024-01-30 ENCOUNTER — Other Ambulatory Visit: Payer: Self-pay

## 2024-01-30 ENCOUNTER — Telehealth: Payer: Self-pay

## 2024-01-30 ENCOUNTER — Encounter (HOSPITAL_COMMUNITY): Payer: Self-pay

## 2024-01-30 DIAGNOSIS — R1013 Epigastric pain: Secondary | ICD-10-CM | POA: Insufficient documentation

## 2024-01-30 DIAGNOSIS — R0789 Other chest pain: Secondary | ICD-10-CM | POA: Insufficient documentation

## 2024-01-30 DIAGNOSIS — R110 Nausea: Secondary | ICD-10-CM | POA: Insufficient documentation

## 2024-01-30 DIAGNOSIS — R109 Unspecified abdominal pain: Secondary | ICD-10-CM

## 2024-01-30 DIAGNOSIS — M549 Dorsalgia, unspecified: Secondary | ICD-10-CM | POA: Insufficient documentation

## 2024-01-30 DIAGNOSIS — D72829 Elevated white blood cell count, unspecified: Secondary | ICD-10-CM | POA: Diagnosis not present

## 2024-01-30 DIAGNOSIS — R1011 Right upper quadrant pain: Secondary | ICD-10-CM | POA: Diagnosis not present

## 2024-01-30 DIAGNOSIS — R1010 Upper abdominal pain, unspecified: Secondary | ICD-10-CM | POA: Diagnosis not present

## 2024-01-30 DIAGNOSIS — R079 Chest pain, unspecified: Secondary | ICD-10-CM | POA: Diagnosis not present

## 2024-01-30 LAB — COMPREHENSIVE METABOLIC PANEL WITH GFR
ALT: 13 U/L (ref 0–44)
AST: 20 U/L (ref 15–41)
Albumin: 4.2 g/dL (ref 3.5–5.0)
Alkaline Phosphatase: 128 U/L — ABNORMAL HIGH (ref 38–126)
Anion gap: 10 (ref 5–15)
BUN: 13 mg/dL (ref 8–23)
CO2: 25 mmol/L (ref 22–32)
Calcium: 9.7 mg/dL (ref 8.9–10.3)
Chloride: 103 mmol/L (ref 98–111)
Creatinine, Ser: 0.83 mg/dL (ref 0.44–1.00)
GFR, Estimated: >60 mL/min
Glucose, Bld: 88 mg/dL (ref 70–99)
Potassium: 4.3 mmol/L (ref 3.5–5.1)
Sodium: 138 mmol/L (ref 135–145)
Total Bilirubin: 0.6 mg/dL (ref 0.0–1.2)
Total Protein: 8.5 g/dL — ABNORMAL HIGH (ref 6.5–8.1)

## 2024-01-30 LAB — LIPASE, BLOOD: Lipase: 46 U/L (ref 11–51)

## 2024-01-30 LAB — URINALYSIS, ROUTINE W REFLEX MICROSCOPIC
Bacteria, UA: NONE SEEN
Bilirubin Urine: NEGATIVE
Glucose, UA: NEGATIVE mg/dL
Hgb urine dipstick: NEGATIVE
Ketones, ur: NEGATIVE mg/dL
Leukocytes,Ua: NEGATIVE
Nitrite: NEGATIVE
Protein, ur: NEGATIVE mg/dL
Specific Gravity, Urine: 1.023 (ref 1.005–1.030)
pH: 5 (ref 5.0–8.0)

## 2024-01-30 LAB — CBC
HCT: 41.3 % (ref 36.0–46.0)
Hemoglobin: 13 g/dL (ref 12.0–15.0)
MCH: 26.4 pg (ref 26.0–34.0)
MCHC: 31.5 g/dL (ref 30.0–36.0)
MCV: 83.8 fL (ref 80.0–100.0)
Platelets: 226 K/uL (ref 150–400)
RBC: 4.93 MIL/uL (ref 3.87–5.11)
RDW: 15.2 % (ref 11.5–15.5)
WBC: 13.4 K/uL — ABNORMAL HIGH (ref 4.0–10.5)
nRBC: 0 % (ref 0.0–0.2)

## 2024-01-30 LAB — TROPONIN T, HIGH SENSITIVITY: Troponin T High Sensitivity: <15 ng/L (ref 0–19)

## 2024-01-30 MED ORDER — OXYCODONE HCL 5 MG PO TABS: 5.0000 mg | ORAL_TABLET | ORAL | 0 refills | Status: AC | PRN

## 2024-01-30 MED ORDER — MORPHINE SULFATE (PF) 4 MG/ML IV SOLN
4.0000 mg | Freq: Once | INTRAVENOUS | Status: AC
  Administered 2024-01-30: 4 mg via INTRAVENOUS
  Filled 2024-01-30: qty 1

## 2024-01-30 MED ORDER — ACETAMINOPHEN 325 MG PO TABS
650.0000 mg | ORAL_TABLET | Freq: Once | ORAL | Status: AC
  Administered 2024-01-30: 650 mg via ORAL
  Filled 2024-01-30: qty 2

## 2024-01-30 MED ORDER — LIDOCAINE 5 % EX PTCH: 1.0000 | MEDICATED_PATCH | Freq: Every day | CUTANEOUS | 0 refills | Status: AC | PRN

## 2024-01-30 MED ORDER — CYCLOBENZAPRINE HCL 10 MG PO TABS: 10.0000 mg | ORAL_TABLET | Freq: Two times a day (BID) | ORAL | 0 refills | Status: AC | PRN

## 2024-01-30 MED ORDER — IOHEXOL 350 MG/ML SOLN
100.0000 mL | Freq: Once | INTRAVENOUS | Status: AC | PRN
  Administered 2024-01-30: 100 mL via INTRAVENOUS

## 2024-01-30 NOTE — Telephone Encounter (Signed)
 Copied from CRM #[id number]. Topic: Clinical - Red Word Triage >> Jan 30, 2024 10:20 AM Olam RAMAN wrote: Red Word that prompted transfer to Nurse Triage: has a sharp pain in stomach and in extreme pain >> Jan 30, 2024 11:30 AM Nurse Lauraine BROCKS, RN wrote: RICK pt in ED per chart review >> Jan 30, 2024 10:35 AM Olam RAMAN wrote: Pt was in so much pain/crying she did not want to hold anymore and was at ER  Pt is at ER

## 2024-01-30 NOTE — Discharge Instructions (Addendum)
 It was a pleasure caring for you today in the emergency department.  Return to the Emergency Department if you have unusual chest pain, pressure, or discomfort, shortness of breath, nausea, vomiting, burping, heartburn, tingling upper body parts, sweating, cold, clammy skin, or racing heartbeat. Call 911 if you think you are having a heart attack. Follow cardiac diet - avoid fatty & fried foods, don't eat too much red meat, eat lots of fruits & vegetables, and dairy products should be low fat. Please lose weight if you are overweight. Become more active with walking, gardening, or any other activity that gets you to moving. No heavy lifting next 5 days.    Please return to the emergency department immediately for any new or concerning symptoms, or if you get worse.

## 2024-01-30 NOTE — ED Triage Notes (Signed)
 Abdominal pain in middle of abdomen that is sharp, started this morning. Nausea, no vomiting, no diarrhea.  Hx of Partial hysterectomy.

## 2024-01-30 NOTE — ED Provider Notes (Signed)
" °  Provider Note MRN:  [id number]  Arrival date & time: 01/30/2024    ED Course and Medical Decision Making  Assumed care from Dr Freddi at shift change.  See note from prior team for complete details, in brief:  Clinical Course as of 01/30/24 1827  Mon Jan 30, 2024  1607 Handoff SG 63 yo/f Here w/ abd/back pain, transient on sat and returned today Epig/thoracic back pain U/s neg CT pending  Dispo pending CT [SG]  1748 CT negative for acute pathology [SG]    Clinical Course User Index [SG] Elnor Savant A, DO   Pt has some pain along her upper back and axilla, ttp, worse w/ torso movement. Likely atypical chest pain, doubt ACS at this time, low risk HEART score  Patient reports she is feeling much better, remaining labs and imaging reviewed and are stable. She is HDS. Well appearing, non-toxic  Provide analgesia for home, encourage follow-up with her PCP  6:27 PM:  I have discussed the diagnosis/risks/treatment options with the patient.  Evaluation and diagnostic testing in the emergency department does not suggest an emergent condition requiring admission or immediate intervention beyond what has been performed at this time.  They will follow up with PCP. We also discussed returning to the ED immediately if new or worsening sx occur. We discussed the sx which are most concerning (e.g., sudden worsening pain, fever, inability to tolerate by mouth) that necessitate immediate return.    The patient appears reasonably screened and/or stabilized for discharge and I doubt any other medical condition or other [facility] requiring further screening, evaluation, or treatment in the ED at this time prior to discharge.     Procedures  Final Clinical Impressions(s) / ED Diagnoses     ICD-10-CM   1. Chest wall pain  R07.89     2. Abdominal pain, unspecified abdominal location  R10.9       ED Discharge Orders     None       Discharge Instructions   None        Elnor Savant LABOR,  DO 01/30/24 1827  "

## 2024-01-30 NOTE — ED Provider Notes (Signed)
 " [place] [facility] AT [facility] Provider Note   CSN: [id number] Arrival date & time: 01/30/24  1035     Patient presents with: Abdominal Pain   Sandra Hawkins is a 63 y.o. female.   HPI 63 year old female presents with abdominal pain.  She first noticed some transient abdominal pain 2 days ago that went away with some Tylenol .  Came back today.  Seems to be intermittent and is also now having some right sided mid back pain.  The back pain hurts when she breathes.  No cough, chest pain, shortness of breath.  No fevers.  Had some nausea and thought she was going to have diarrhea but had a normal bowel movement.  Has a prior history of a hysterectomy.  Prior to Admission medications  Medication Sig Start Date End Date Taking? Authorizing Provider  Cyanocobalamin  (B-12 PO) Take by mouth.    [provider]  Multiple Vitamin (MULTIVITAMIN) tablet Take 1 tablet by mouth daily.    [provider]  OZEMPIC , 2 MG/DOSE, 8 MG/3ML SOPN INJECT 2MG  SUBCUTANEOUSLY ONCE A WEEK 12/23/23   Wendolyn Jenkins Jansky, MD    Allergies: Cortizone-10 feminine itch [hydrocortisone], Dairy aid [tilactase], Hetastarch, Lactose, Shellfish allergy, Starch, Tramadol , and Cortisone    Review of Systems  Constitutional:  Negative for fever.  Respiratory:  Negative for shortness of breath.   Cardiovascular:  Negative for chest pain.  Gastrointestinal:  Positive for abdominal pain and nausea. Negative for vomiting.  Genitourinary:  Negative for dysuria.  Musculoskeletal:  Positive for back pain.    Updated Vital Signs BP 117/86 (BP Location: Right Arm)   Pulse 83   Temp 98.9 F (37.2 C) (Oral)   Resp 18   Ht 5' 7 (1.702 m)   Wt 79.4 kg   LMP 08/07/2011   SpO2 98%   BMI 27.41 kg/m   Physical Exam Vitals and nursing note reviewed.  Constitutional:      General: She is not in acute distress.    Appearance: She is well-developed. She is not ill-appearing or  diaphoretic.  HENT:     Head: Normocephalic and atraumatic.  Cardiovascular:     Rate and Rhythm: Normal rate and regular rhythm.     Heart sounds: Normal heart sounds.  Pulmonary:     Effort: Pulmonary effort is normal.     Breath sounds: Normal breath sounds. No wheezing, rhonchi or rales.  Abdominal:     Palpations: Abdomen is soft.     Tenderness: There is abdominal tenderness in the epigastric area. There is no right CVA tenderness or left CVA tenderness.  Skin:    General: Skin is warm and dry.  Neurological:     Mental Status: She is alert.     (all labs ordered are listed, but only abnormal results are displayed) Labs Reviewed  COMPREHENSIVE METABOLIC PANEL WITH GFR - Abnormal; Notable for the following components:      Result Value   Total Protein 8.5 (*)    Alkaline Phosphatase 128 (*)    All other components within normal limits  CBC - Abnormal; Notable for the following components:   WBC 13.4 (*)    All other components within normal limits  LIPASE, BLOOD  URINALYSIS, ROUTINE W REFLEX MICROSCOPIC    EKG: None  Radiology: No results found.   Procedures   Medications Ordered in the ED  acetaminophen  (TYLENOL ) tablet 650 mg (has no administration in time range)  Medical Decision Making Amount and/or Complexity of Data Reviewed Labs: ordered.    Details: Leukocytosis Radiology: ordered and independent interpretation performed.    Details: No cholelithiasis ECG/medicine tests: ordered.  Risk OTC drugs. Prescription drug management.   Patient presents with abdominal pain.  Also having a pleuritic component.  Initial workup shows a mild leukocytosis.  Given initial negative workup and worsening pain, will get a CTA of her chest (for the pleuritic component) and a CT of the abdomen for her upper abdominal pain.  Her vital signs are normal including her oxygen and blood pressure.  I do think dissection is unlikely.  ECG  has been ordered and a troponin will be sent.  Care transferred to Dr. Elnor.     Final diagnoses:  None    ED Discharge Orders     None          Freddi Hamilton, MD 01/30/24 [phone]  "

## 2024-02-01 ENCOUNTER — Telehealth: Payer: Self-pay

## 2024-02-01 NOTE — Telephone Encounter (Signed)
 Transition Care Management Follow-up Telephone Call Date of discharge and from where: 01/30/24 [facility] How have you been since you were released from the hospital? Same/ a little better Any questions or concerns? No  Items Reviewed: Did the pt receive and understand the discharge instructions provided? No  Medications obtained and verified? Yes  Other? No  Any new allergies since your discharge? No  Dietary orders reviewed? No Do you have support at home? No   Home Care and Equipment/Supplies: Were home health services ordered? not applicable If so, what is the name of the agency?   Has the agency set up a time to come to the patient's home? not applicable Were any new equipment or medical supplies ordered?  No What is the name of the medical supply agency?  Were you able to get the supplies/equipment? not applicable Do you have any questions related to the use of the equipment or supplies? No  Functional Questionnaire: (I = Independent and D = Dependent) ADLs: I  Bathing/Dressing- I  Meal Prep- I  Eating- I  Maintaining continence- I  Transferring/Ambulation- I  Managing Meds- I  Follow up appointments reviewed:  PCP Hospital f/u appt confirmed? Yes  Scheduled to see Jenkins Carrel on 02/08/24 @ 11:30am. Specialist Hospital f/u appt confirmed? N/A  Are transportation arrangements needed? No  If their condition worsens, is the pt aware to call PCP or go to the Emergency Dept.? Yes Was the patient provided with contact information for the PCP's office or ED? Yes Was to pt encouraged to call back with questions or concerns? Yes

## 2024-02-08 ENCOUNTER — Inpatient Hospital Stay: Admitting: Family Medicine

## 2024-02-10 ENCOUNTER — Encounter: Payer: Self-pay | Admitting: Family Medicine

## 2024-02-13 ENCOUNTER — Ambulatory Visit: Admitting: Family Medicine

## 2024-02-21 ENCOUNTER — Ambulatory Visit: Admitting: Family Medicine

## 2024-02-21 ENCOUNTER — Ambulatory Visit: Payer: Self-pay | Admitting: Family Medicine

## 2024-02-21 ENCOUNTER — Encounter: Payer: Self-pay | Admitting: Family Medicine

## 2024-02-21 ENCOUNTER — Other Ambulatory Visit: Payer: Self-pay

## 2024-02-21 ENCOUNTER — Telehealth: Payer: Self-pay | Admitting: Family Medicine

## 2024-02-21 VITALS — BP 118/74 | HR 76 | Temp 96.4°F | Ht 67.0 in | Wt 175.4 lb

## 2024-02-21 DIAGNOSIS — E538 Deficiency of other specified B group vitamins: Secondary | ICD-10-CM | POA: Diagnosis not present

## 2024-02-21 DIAGNOSIS — Z7985 Long-term (current) use of injectable non-insulin antidiabetic drugs: Secondary | ICD-10-CM | POA: Diagnosis not present

## 2024-02-21 DIAGNOSIS — Z23 Encounter for immunization: Secondary | ICD-10-CM

## 2024-02-21 DIAGNOSIS — E119 Type 2 diabetes mellitus without complications: Secondary | ICD-10-CM | POA: Diagnosis not present

## 2024-02-21 LAB — COMPREHENSIVE METABOLIC PANEL WITH GFR
ALT: 12 U/L (ref 3–35)
AST: 15 U/L (ref 5–37)
Albumin: 4 g/dL (ref 3.5–5.2)
Alkaline Phosphatase: 102 U/L (ref 39–117)
BUN: 11 mg/dL (ref 6–23)
CO2: 26 meq/L (ref 19–32)
Calcium: 9.1 mg/dL (ref 8.4–10.5)
Chloride: 105 meq/L (ref 96–112)
Creatinine, Ser: 0.82 mg/dL (ref 0.40–1.20)
GFR: 77.01 mL/min
Glucose, Bld: 79 mg/dL (ref 70–99)
Potassium: 3.7 meq/L (ref 3.5–5.1)
Sodium: 139 meq/L (ref 135–145)
Total Bilirubin: 0.7 mg/dL (ref 0.2–1.2)
Total Protein: 8.2 g/dL (ref 6.0–8.3)

## 2024-02-21 LAB — CBC WITH DIFFERENTIAL/PLATELET
Basophils Absolute: 0 K/uL (ref 0.0–0.1)
Basophils Relative: 0.3 % (ref 0.0–3.0)
Eosinophils Absolute: 0.1 K/uL (ref 0.0–0.7)
Eosinophils Relative: 1.3 % (ref 0.0–5.0)
HCT: 38 % (ref 36.0–46.0)
Hemoglobin: 12.5 g/dL (ref 12.0–15.0)
Lymphocytes Relative: 36.1 % (ref 12.0–46.0)
Lymphs Abs: 2.9 K/uL (ref 0.7–4.0)
MCHC: 32.8 g/dL (ref 30.0–36.0)
MCV: 82.5 fl (ref 78.0–100.0)
Monocytes Absolute: 0.4 K/uL (ref 0.1–1.0)
Monocytes Relative: 4.9 % (ref 3.0–12.0)
Neutro Abs: 4.6 K/uL (ref 1.4–7.7)
Neutrophils Relative %: 57.4 % (ref 43.0–77.0)
Platelets: 199 K/uL (ref 150.0–400.0)
RBC: 4.61 Mil/uL (ref 3.87–5.11)
RDW: 15.4 % (ref 11.5–15.5)
WBC: 8 K/uL (ref 4.0–10.5)

## 2024-02-21 LAB — HEMOGLOBIN A1C: Hgb A1c MFr Bld: 5.9 % (ref 4.6–6.5)

## 2024-02-21 LAB — MICROALBUMIN / CREATININE URINE RATIO
Creatinine,U: 258.8 mg/dL
Microalb Creat Ratio: 8.6 mg/g (ref 0.0–30.0)
Microalb, Ur: 2.2 mg/dL — ABNORMAL HIGH (ref 0.7–1.9)

## 2024-02-21 LAB — VITAMIN B12: Vitamin B-12: 366 pg/mL (ref 211–911)

## 2024-02-21 MED ORDER — OZEMPIC (2 MG/DOSE) 8 MG/3ML ~~LOC~~ SOPN: 2.0000 mg | PEN_INJECTOR | SUBCUTANEOUS | 1 refills | Status: AC

## 2024-02-21 NOTE — Patient Instructions (Addendum)
 5 minutes per day on bike  [facility]- [phone] for mammogram, [phone] other studies

## 2024-02-21 NOTE — Telephone Encounter (Signed)
 Pt wanted to make sure Ozempic  was refilled today also

## 2024-02-21 NOTE — Progress Notes (Signed)
 "  Subjective:     Patient ID: Sandra Hawkins, female    DOB: Mar 30, 1960, 64 y.o.   MRN: [id number]  Chief Complaint  Patient presents with   Diabetes    Pt is here for chronic issues    Discussed the use of AI scribe software for clinical note transcription with the patient, who gave verbal consent to proceed.  History of Present Illness Sandra Hawkins is a 64 year old female with diabetes and vitamin B12 deficiency who presents for follow-up.  She recently visited the emergency room on December 22nd due to chest pain. The pain began as a sharp sensation in the upper abdomen, described as 'like somebody was sticking a long needle through my stomach,' and was severe enough to impair her ability to walk and breathe. The pain then moved to her chest and back, resembling muscle aggravation. A CT scan of the chest and abdomen was performed during her emergency room visit. She was given medication and patches during her emergency room visit. No current chest pain or respiratory symptoms are reported.  Her diabetes is managed with Ozempic  2 mg. She has recently started making dietary changes, including reducing sweets and sodas, and has not had a soda since the beginning of the year. She wants to increase her physical activity and plans to use a stationary bike to help with this. She has not been exercising regularly due to stress and knee issues, including a previous knee replacement and ongoing problems with the other knee. She experiences occasional nausea and loose stools.  For her vitamin B12 deficiency, she had previously stopped taking B12 supplements due to severe burping, which resolved after discontinuation. She is currently taking CMOS gel but is unsure if it contains B12.  She reports issues with her vision, describing it as 'cloudy,' even with glasses, and plans to seek a new eye doctor for further evaluation.  She has a history of a partial hysterectomy due to fibroids. She  works in a stressful job and plans to retire in two years. She has a granddaughter and is motivated to improve her health to be active with her family.    Health Maintenance Due  Topic Date Due   Mammogram  06/23/2023    Past Medical History:  Diagnosis Date   Allergy    Anxiety    Arthritis    Rheumatoid - knees   Diabetes mellitus without complication (HCC)    Fibroid 04/03/2007   H/O dysmenorrhea 02/09/2007   H/O menorrhagia 02/08/2005   H/O varicella    Hepatitis B 11/14/2006   History of measles, mumps, or rubella    History of nausea 02/09/2007   With menses    Irregular menses    Monilial vaginitis 02/09/2004   Osteoporosis    Pain pelvic    Seasonal allergies    Spasm of muscle, back    09-08-2013, therepy helped   SVD (spontaneous vaginal delivery)    x 1   Urge incontinence 02/08/2006    Past Surgical History:  Procedure Laterality Date   BLADDER SUSPENSION  09/20/2011   Procedure: TRANSVAGINAL TAPE (TVT) PROCEDURE;  Surgeon: Jon CINDERELLA Rummer, MD;  Location: [facility];  Service: Gynecology;  Laterality: N/A;   BREAST BIOPSY Left    CYSTOSCOPY  09/20/2011   Procedure: CYSTOSCOPY;  Surgeon: Jon CINDERELLA Rummer, MD;  Location: [facility];  Service: Gynecology;;   CYSTOSCOPY N/A 11/10/2012   Procedure: CYSTOSCOPY;  Surgeon: Jon CINDERELLA Rummer, MD;  Location: [facility];  Service: Gynecology;  Laterality: N/A;   DILATION AND CURETTAGE OF UTERUS     JOINT REPLACEMENT     right knee   KNEE SURGERY     LAPAROSCOPIC HYSTERECTOMY  09/20/2011   Procedure: HYSTERECTOMY TOTAL LAPAROSCOPIC;  Surgeon: Jon CINDERELLA Rummer, MD;  Location: [facility];  Service: Gynecology;  Laterality: N/A;   MYOMECTOMY ABDOMINAL APPROACH  04/03/2007   TRANSVAGINAL TAPE (TVT) REMOVAL N/A 11/10/2012   Procedure: TRANSVAGINAL TAPE (TVT) REMOVAL;  Surgeon: Jon CINDERELLA Rummer, MD;  Location: [facility];  Service: Gynecology;  Laterality: N/A;  Removal of Mesh   WISDOM TOOTH EXTRACTION      Current  Medications[1]  Allergies[2] ROS neg/noncontributory except as noted HPI/below      Objective:     BP 118/74 (BP Location: Left Arm, Patient Position: Sitting, Cuff Size: Normal)   Pulse 76   Temp (!) 96.4 F (35.8 C) (Temporal)   Ht 5' 7 (1.702 m)   Wt 175 lb 6 oz (79.5 kg)   LMP 08/07/2011   SpO2 98%   BMI 27.47 kg/m  Wt Readings from Last 3 Encounters:  02/21/24 175 lb 6 oz (79.5 kg)  01/30/24 175 lb (79.4 kg)  08/19/23 178 lb 6 oz (80.9 kg)    Physical Exam GENERAL: Well developed well nourished no acute distress HEAD EYES EARS NOSE THROAT: Normocephalic atraumatic, conjunctiva not injected, sclera nonicteric CARDIAC: Regular rate and rhythm, S1 S2 present , no murmur, dorsalis pedis 2 plus bilaterally NECK: Supple, no thyromegaly, no nodes, no carotid bruits LUNGS: Clear to auscultation bilaterally, no wheezes ABDOMEN: Bowel sounds present, soft, non tender non distended, no hepatosplenomegaly, no masses EXTREMITIES: No edema MUSCULOSKELETAL: No gross abnormalities x some limping from knees NEUROLOGICAL: Alert and oriented x3, cranial nerves II through XII intact PSYCHIATRIC: Normal mood, good eye contact       Assessment & Plan:  Type 2 diabetes mellitus without complication, without long-term current use of insulin  (HCC) -     CBC with Differential/Platelet -     Comprehensive metabolic panel with GFR -     Hemoglobin A1c -     Microalbumin / creatinine urine ratio -     Ozempic  (2 MG/DOSE); Inject 2 mg into the skin once a week.  Dispense: 9 mL; Refill: 1  Vitamin B12 deficiency -     Vitamin B12  Immunization due -     Flu vaccine trivalent PF, 6mos and older(Flulaval,Afluria,Fluarix,Fluzone)    Assessment and Plan Assessment & Plan Type 2 diabetes mellitus   Managed with Ozempic  2 mg. She is making dietary changes by eliminating soda and sweets and plans to increase physical activity. Reports occasional nausea and loose stools, likely due to  Ozempic . Discussed potential for diabetes remission with lifestyle changes and medication adherence. Insurance covers Ozempic  due to diabetes diagnosis. Continue Ozempic  2 mg. Encourage dietary changes and gradual increase in physical activity, starting with 5 minutes of stationary biking daily. Consider alternative medications like Mounjaro if weight and blood sugar levels do not improve.  Vitamin B12 deficiency   Previously managed with over-the-counter B12 supplements. Experienced burping and nausea, which resolved after stopping B12. Currently not taking supplements and monitoring symptoms. Monitor symptoms and consider reintroducing B12 supplements if deficiency symptoms recur.  Osteoarthritis of knee, status post knee replacement   Reports knee pain and difficulty with prolonged walking. Using a stationary bike to reduce knee stress. Orthopedic restrictions in place at work. Discussed benefits of stationary biking for knee  health. Continue using stationary bike for exercise, starting with 5 minutes daily and gradually increasing. Maintain orthopedic work restrictions.  General Health Maintenance   Discussed importance of regular screenings and vaccinations. She is due for a mammogram and has not received a pneumonia vaccine yet. Recent flu shot administered. Schedule mammogram through MyChart or by phone. Consider pneumonia vaccine at next visit.     Return in about 3 months (around 05/21/2024) for DM.  Jenkins CHRISTELLA Carrel, MD     [1]  Current Outpatient Medications:    Cyanocobalamin  (B-12 PO), Take by mouth., Disp: , Rfl:    cyclobenzaprine  (FLEXERIL ) 10 MG tablet, Take 1 tablet (10 mg total) by mouth 2 (two) times daily as needed for muscle spasms., Disp: 10 tablet, Rfl: 0   lidocaine  (LIDODERM ) 5 %, Place 1 patch onto the skin daily as needed. Remove & Discard patch within 12 hours or as directed by MD, Disp: 15 patch, Rfl: 0   Multiple Vitamin (MULTIVITAMIN) tablet, Take 1 tablet by mouth  daily., Disp: , Rfl:    oxyCODONE  (ROXICODONE ) 5 MG immediate release tablet, Take 1 tablet (5 mg total) by mouth every 4 (four) hours as needed for severe pain (pain score 7-10)., Disp: 10 tablet, Rfl: 0   Semaglutide , 2 MG/DOSE, (OZEMPIC , 2 MG/DOSE,) 8 MG/3ML SOPN, Inject 2 mg into the skin once a week., Disp: 9 mL, Rfl: 1 [2]  Allergies Allergen Reactions   Cortizone-10 Feminine Itch [Hydrocortisone] Other (See Comments)   Dairy Aid [Tilactase] Other (See Comments)    bloating   Hetastarch Other (See Comments)   Lactose Other (See Comments)   Shellfish Allergy Hives and Other (See Comments)   Starch Other (See Comments)    bloating   Tramadol  Itching   Cortisone Rash   "

## 2024-02-21 NOTE — Progress Notes (Signed)
 Labs great.  Same meds

## 2024-02-23 NOTE — Progress Notes (Signed)
 Called pt and notified.

## 2024-05-29 ENCOUNTER — Ambulatory Visit: Admitting: Family Medicine
